# Patient Record
Sex: Male | Born: 1965 | Race: Black or African American | Hispanic: No | Smoking: Never smoker
Health system: Southern US, Community
[De-identification: ages and names within clinical notes are randomized; demographics above are authoritative.]

## PROBLEM LIST (undated history)

## (undated) ENCOUNTER — Emergency Department (HOSPITAL_COMMUNITY): Admission: EM | Payer: Self-pay | Source: Home / Self Care

## (undated) DIAGNOSIS — D763 Other histiocytosis syndromes: Secondary | ICD-10-CM

## (undated) DIAGNOSIS — E8889 Other specified metabolic disorders: Secondary | ICD-10-CM

## (undated) HISTORY — PX: TONSILLECTOMY: SUR1361

---

## 1998-08-03 ENCOUNTER — Encounter: Admission: RE | Admit: 1998-08-03 | Discharge: 1998-08-03 | Payer: Self-pay | Admitting: Family Medicine

## 1999-01-17 ENCOUNTER — Encounter: Admission: RE | Admit: 1999-01-17 | Discharge: 1999-01-17 | Payer: Self-pay | Admitting: Family Medicine

## 1999-09-25 ENCOUNTER — Encounter: Admission: RE | Admit: 1999-09-25 | Discharge: 1999-09-25 | Payer: Self-pay | Admitting: Sports Medicine

## 2000-01-18 ENCOUNTER — Encounter: Admission: RE | Admit: 2000-01-18 | Discharge: 2000-01-18 | Payer: Self-pay | Admitting: Family Medicine

## 2000-02-06 ENCOUNTER — Encounter: Admission: RE | Admit: 2000-02-06 | Discharge: 2000-02-06 | Payer: Self-pay | Admitting: Family Medicine

## 2003-06-10 ENCOUNTER — Encounter: Admission: RE | Admit: 2003-06-10 | Discharge: 2003-06-10 | Payer: Self-pay | Admitting: Family Medicine

## 2003-07-11 ENCOUNTER — Encounter: Admission: RE | Admit: 2003-07-11 | Discharge: 2003-07-11 | Payer: Self-pay | Admitting: Family Medicine

## 2005-05-12 ENCOUNTER — Emergency Department (HOSPITAL_COMMUNITY): Admission: EM | Admit: 2005-05-12 | Discharge: 2005-05-12 | Payer: Self-pay | Admitting: Family Medicine

## 2005-05-20 ENCOUNTER — Ambulatory Visit: Payer: Self-pay | Admitting: Family Medicine

## 2005-06-21 ENCOUNTER — Ambulatory Visit: Payer: Self-pay | Admitting: Family Medicine

## 2005-10-22 ENCOUNTER — Ambulatory Visit: Payer: Self-pay | Admitting: Sports Medicine

## 2006-03-13 ENCOUNTER — Ambulatory Visit: Payer: Self-pay | Admitting: Family Medicine

## 2006-04-15 ENCOUNTER — Ambulatory Visit: Payer: Self-pay | Admitting: *Deleted

## 2006-06-05 DIAGNOSIS — E7800 Pure hypercholesterolemia, unspecified: Secondary | ICD-10-CM

## 2006-06-05 DIAGNOSIS — I10 Essential (primary) hypertension: Secondary | ICD-10-CM

## 2006-06-05 DIAGNOSIS — G43909 Migraine, unspecified, not intractable, without status migrainosus: Secondary | ICD-10-CM | POA: Insufficient documentation

## 2006-06-05 DIAGNOSIS — F329 Major depressive disorder, single episode, unspecified: Secondary | ICD-10-CM

## 2006-07-07 ENCOUNTER — Ambulatory Visit: Payer: Self-pay | Admitting: Family Medicine

## 2006-07-07 DIAGNOSIS — F528 Other sexual dysfunction not due to a substance or known physiological condition: Secondary | ICD-10-CM | POA: Insufficient documentation

## 2006-07-08 LAB — CONVERTED CEMR LAB
BUN: 11 mg/dL (ref 6–23)
CO2: 25 meq/L (ref 19–32)
Calcium: 9 mg/dL (ref 8.4–10.5)
Chloride: 103 meq/L (ref 96–112)
Cholesterol: 201 mg/dL — ABNORMAL HIGH (ref 0–200)
Glucose, Bld: 89 mg/dL (ref 70–99)
Potassium: 4.1 meq/L (ref 3.5–5.3)
Total CHOL/HDL Ratio: 6.1
VLDL: 29 mg/dL (ref 0–40)

## 2006-07-21 ENCOUNTER — Encounter: Payer: Self-pay | Admitting: Family Medicine

## 2006-07-21 DIAGNOSIS — D126 Benign neoplasm of colon, unspecified: Secondary | ICD-10-CM

## 2006-09-18 ENCOUNTER — Telehealth (INDEPENDENT_AMBULATORY_CARE_PROVIDER_SITE_OTHER): Payer: Self-pay | Admitting: *Deleted

## 2006-09-26 ENCOUNTER — Ambulatory Visit: Payer: Self-pay | Admitting: Family Medicine

## 2006-10-08 ENCOUNTER — Ambulatory Visit: Payer: Self-pay | Admitting: Family Medicine

## 2006-10-08 ENCOUNTER — Telehealth: Payer: Self-pay | Admitting: *Deleted

## 2006-10-08 ENCOUNTER — Ambulatory Visit (HOSPITAL_COMMUNITY): Admission: RE | Admit: 2006-10-08 | Discharge: 2006-10-08 | Payer: Self-pay | Admitting: Family Medicine

## 2006-10-09 ENCOUNTER — Inpatient Hospital Stay (HOSPITAL_COMMUNITY): Admission: AD | Admit: 2006-10-09 | Discharge: 2006-10-10 | Payer: Self-pay | Admitting: Family Medicine

## 2006-11-18 ENCOUNTER — Telehealth: Payer: Self-pay | Admitting: Family Medicine

## 2006-12-11 ENCOUNTER — Encounter: Payer: Self-pay | Admitting: Family Medicine

## 2007-01-23 ENCOUNTER — Encounter: Payer: Self-pay | Admitting: Family Medicine

## 2007-02-06 ENCOUNTER — Ambulatory Visit: Payer: Self-pay | Admitting: Family Medicine

## 2007-02-27 ENCOUNTER — Encounter (INDEPENDENT_AMBULATORY_CARE_PROVIDER_SITE_OTHER): Payer: Self-pay | Admitting: *Deleted

## 2008-02-26 ENCOUNTER — Ambulatory Visit: Payer: Self-pay | Admitting: Family Medicine

## 2008-02-29 LAB — CONVERTED CEMR LAB
AST: 28 units/L (ref 0–37)
Albumin: 4.5 g/dL (ref 3.5–5.2)
Alkaline Phosphatase: 47 units/L (ref 39–117)
Calcium: 9.4 mg/dL (ref 8.4–10.5)
Chloride: 101 meq/L (ref 96–112)
Glucose, Bld: 87 mg/dL (ref 70–99)
LDL Cholesterol: 154 mg/dL — ABNORMAL HIGH (ref 0–99)
Potassium: 3.7 meq/L (ref 3.5–5.3)
Sodium: 136 meq/L (ref 135–145)
Total Protein: 8.6 g/dL — ABNORMAL HIGH (ref 6.0–8.3)

## 2008-08-03 ENCOUNTER — Encounter: Payer: Self-pay | Admitting: Family Medicine

## 2008-08-04 DIAGNOSIS — I2510 Atherosclerotic heart disease of native coronary artery without angina pectoris: Secondary | ICD-10-CM | POA: Insufficient documentation

## 2009-03-15 ENCOUNTER — Ambulatory Visit: Payer: Self-pay | Admitting: Family Medicine

## 2009-03-15 DIAGNOSIS — E8881 Metabolic syndrome: Secondary | ICD-10-CM

## 2009-03-15 LAB — CONVERTED CEMR LAB

## 2009-03-16 DIAGNOSIS — R972 Elevated prostate specific antigen [PSA]: Secondary | ICD-10-CM

## 2009-03-16 LAB — CONVERTED CEMR LAB
AST: 18 units/L (ref 0–37)
Albumin: 4.6 g/dL (ref 3.5–5.2)
BUN: 11 mg/dL (ref 6–23)
CO2: 24 meq/L (ref 19–32)
Calcium: 9.5 mg/dL (ref 8.4–10.5)
Chloride: 99 meq/L (ref 96–112)
Cholesterol: 205 mg/dL — ABNORMAL HIGH (ref 0–200)
Glucose, Bld: 80 mg/dL (ref 70–99)
HCT: 43.9 % (ref 39.0–52.0)
HDL: 39 mg/dL — ABNORMAL LOW (ref >39)
Hemoglobin: 15.2 g/dL (ref 13.0–17.0)
PSA: 2.34 ng/mL (ref 0.10–4.00)
Potassium: 4 meq/L (ref 3.5–5.3)
RBC: 4.8 M/uL (ref 4.22–5.81)
Triglycerides: 123 mg/dL (ref <150)

## 2009-03-20 ENCOUNTER — Encounter: Payer: Self-pay | Admitting: Family Medicine

## 2009-03-23 ENCOUNTER — Encounter: Payer: Self-pay | Admitting: Family Medicine

## 2009-03-27 ENCOUNTER — Encounter: Payer: Self-pay | Admitting: Family Medicine

## 2010-04-23 ENCOUNTER — Encounter: Payer: Self-pay | Admitting: Family Medicine

## 2010-05-10 NOTE — Miscellaneous (Signed)
Summary: med refill via fax request  Clinical Lists Changes  Medications: Changed medication from LISINOPRIL-HYDROCHLOROTHIAZIDE 10-12.5 MG  TABS (LISINOPRIL-HYDROCHLOROTHIAZIDE) one by mouth daily to LISINOPRIL-HYDROCHLOROTHIAZIDE 10-12.5 MG  TABS (LISINOPRIL-HYDROCHLOROTHIAZIDE) one by mouth daily - Signed Rx of LISINOPRIL-HYDROCHLOROTHIAZIDE 10-12.5 MG  TABS (LISINOPRIL-HYDROCHLOROTHIAZIDE) one by mouth daily;  #90 x 3;  Signed;  Entered by: Doralee Albino MD;  Authorized by: Doralee Albino MD;  Method used: Electronically to [facility] [address], [place], [place], [place]  [address], Ph: [phone], Fax: [phone]    Prescriptions: LISINOPRIL-HYDROCHLOROTHIAZIDE 10-12.5 MG  TABS (LISINOPRIL-HYDROCHLOROTHIAZIDE) one by mouth daily  #90 x 3   Entered and Authorized by:   Doralee Albino MD   Signed by:   Doralee Albino MD on 04/23/2010   Method used:   Electronically to        [facility] [address] (retail)       [address]       [place], [place]  [address]       Ph: [phone]       Fax: [phone]   RxID:   [id number]

## 2010-05-21 ENCOUNTER — Telehealth: Payer: Self-pay | Admitting: Family Medicine

## 2010-05-21 MED ORDER — TADALAFIL 2.5 MG PO TABS: 2.5000 mg | ORAL_TABLET | Freq: Every day | ORAL | Status: DC

## 2010-05-21 NOTE — Telephone Encounter (Signed)
Called and refilled for daily use.

## 2010-06-07 ENCOUNTER — Telehealth: Payer: Self-pay | Admitting: Family Medicine

## 2010-06-07 NOTE — Telephone Encounter (Signed)
Pt states that he is having a hard time with his insurance company - they are telling him that his doctor has to call them to approve him getting more that 15 pills in a 90 day period of Cialis.   [employer]- [phone]

## 2010-06-15 NOTE — Telephone Encounter (Signed)
Called and discussed options.  No change for now.  He will call back if he wants me to change Rx.

## 2010-08-21 NOTE — Discharge Summary (Signed)
NAMEDANDRAE, KUSTRA                 ACCOUNT NO.:  [id number]   MEDICAL RECORD NO.:  [id number]          PATIENT TYPE:  OBV   LOCATION:  4707                         FACILITY:  [facility]   PHYSICIAN:  Leighton Roach McDiarmid, M.D.DATE OF BIRTH:  01/12/1966   DATE OF ADMISSION:  10/08/2006  DATE OF DISCHARGE:  10/10/2006                               DISCHARGE SUMMARY   PRIMARY CARE PHYSICIAN:  Dr. Leveda Anna at the [facility]  Center.   DISCHARGE DIAGNOSES:  1. Coronary artery disease.  2. Hypertension.   DISCHARGE MEDICATIONS:  1. Hydrochlorothiazide 25 mg daily.  2. Aspirin 325 mg daily.  3. Simvastatin 20 mg in the evening.   CONSULTATIONS:  Cardiology, Dr. Allyson Sabal of [facility] and  Vascular.   PROCEDURES:  A heart catheterization was done on October 09, 2006 by Dr.  Allyson Sabal, as well as an EKG on the morning of July 3, which showed T wave  inversion in leads V3 through V6.   LABORATORY DATA:  Labs on discharge were the following:  A sodium was  137, potassium 3.1, chloride 100, bicarb 30, glucose 96, BUN 7,  creatinine 0.91, calcium 9.0.  WBCs 11.9, hemoglobin 15.1, hematocrit  44.9, and a platelet count of 289.   HOSPITAL COURSE:  Mr. Strohm is a 45 year old African American male,  nondiabetic, smoker, with a history of hypertension.  He was admitted  from the family practice center for new onset atypical chest pain  believed to be unstable angina.  The pain was substernal and came on at  rest.  It lasted several minutes, and went away on its own.  It occurred  once the day before admission, and awoke him from sleep the night before  admission.  An ECG done in the office showed inverted T waves in leads  V4 through V6.  He was admitted to a telemetry floor, started on  aspirin, Protonix 40 mg daily, simvastatin 20 mg daily, as well as  nitroglycerin 0.4 mg sublingually q.5 minutes p.r.n.  Cardiac enzymes  were drawn and were negative x3.  An LDL drawn at a previous  family  medicine center visit were found to be 139.  An ECG done on July 3  showed inverted T waves in V3 through V6 with the V3 a new development.  The patient also had 2 episodes of chest pain, lasting approximately 15-  20 seconds while in the hospital.  Due to new ECG changes and the T wave  change in lead V3, as well as continued chest pain, cardiology was  consulted to further evaluate and treat.  The patient was seen by Dr.  Allyson Sabal of [facility] and Vascular on October 09, 2006, and the  decision was made to do a heart catheterization.  He was found to have  noncritical coronary artery disease, and Dr. Allyson Sabal recommended risk  factor modification, including diet, exercise, and smoking cessation, as  well as the daily aspirin and statin.   On October 10, 2006, the patient was discharged home following  administration of 40 mEq of K-Dur p.o. x1 to  correct for the low  potassium drawn that morning.  He was sent home with instructions to  follow up with Dr. Allyson Sabal at [facility] and Vascular.  He was  told though that Dr. Hazle Coca office would be contacting him to setup the  appointment, and this should be in approximately 1-2 weeks.  He was also  instructed to follow up with Dr. Leveda Anna, his primary care physician at  the [facility].  The number for the center was  given, as the office was closed, so the appointment could not be made  for him.  A prescription for simvastatin was also given to him, as he  was not currently on it before he came into the hospital.   DISCHARGE INSTRUCTIONS:  He was sent home with the above instructions to  follow up with Dr. Leveda Anna and Dr. Allyson Sabal.  He was also advised to wash  his right groin cath site with soap and water, and to call Dr. Hazle Coca  office if there was any bleeding, swelling or drainage of the site.  He  was also advised to increase his activity slowly, and was told no  lifting more than 50 pounds for 2 days, and  no driving for 2 days.  He  was instructed to follow a low-sodium, heart healthy diet.  Followup  appointments as stated above.  He is to see Dr. Allyson Sabal at his office  within the next 1-2 weeks.  Dr. Hazle Coca office will be contacting him to  setup this appointment.  He is also to follow up with Dr. Leveda Anna in the  [facility] in 1-2 weeks.  He needs to call to  make this appointment himself.  The patient was discharged home in  stable medical condition.   Issues to Follow up on:  1. Check Potassium  2. Check lipids  3. Blood Pressure  4. Smoking Cessation  5. Attempts at lifestyle modification      Ardeen Garland, MD  Electronically Signed      Leighton Roach McDiarmid, M.D.  Electronically Signed    LM/MEDQ  D:  10/10/2006  T:  10/10/2006  Job:  [id number]

## 2010-08-21 NOTE — Cardiovascular Report (Signed)
NAMEMIKLOS, Marco Ballard NO.:  [id number]   MEDICAL RECORD NO.:  [id number]          PATIENT TYPE:  OBV   LOCATION:  4707                         FACILITY:  [facility]   PHYSICIAN:  Nanetta Batty, M.D.   DATE OF BIRTH:  1965-06-19   DATE OF PROCEDURE:  DATE OF DISCHARGE:                            CARDIAC CATHETERIZATION   Marco Ballard is a 45 year old, separated, African-American male who works  at __________  with positive risk factors and recent symptoms compatible  with unstable angina.  He has been admitted with chest pain.  He ruled  out.  He had anterolateral T-wave inversion treated with aspirin,  Plavix, and Lovenox.  He presents now for diagnostic coronary  arteriography to define his anatomy and rule out an ischemic etiology.   DESCRIPTION OF PROCEDURE:  The patient was brought to the second floor  [facility] cardiac catheterization lab in the postabsorptive state.  He  was premedicated with p.o. Valium.  His right groin was prepped and  shaved in the usual sterile fashion.  One percent Xylocaine was used for  local anesthesia.  A 6-French sheath was inserted into the right femoral  artery using standard Seldinger technique.  The 6-French right and left  Judkins diagnostic catheters along with 6-French pigtail catheter were  used for selective coronary angiography, left ventriculography,  respectively.  Visipaque dye was used for the entirety of the case.  Retrograde aortic, ventricular and pullback pressures were recorded.   HEMODYNAMICS:  Aortic systolic pressure 123, diastolic pressure 77.   Ventricle systolic pressure 122, end-diastolic pressure 9.   SELECTIVE CORONARY ANGIOGRAPHY:  1. Left main normal.  2. LAD; the LAD had a 40-50% stenosis in the midportion just before      __________  diagonal branch.  3. Circumflex; nondominant and free of significant disease.  4. Ramus branch; moderate in size and free of significant disease.  5. Right coronary  artery; dominant with 50% stenosis at the crux.   Left ventriculography; RAO left ventriculogram performed using 25 mL of  Visipaque dye at 12 mL per second.  Overall LVEF was estimated at  greater than 60% without focal wall motion abnormalities.   IMPRESSION:  Marco Ballard has noncritical CAD with normal LV function.  I  believe his chest pain is noncardiac, most likely related to reflux  and/or stress.  Sheath was removed, and pressure was applied to the  groin to achieve  hemostasis.  The patient left the lab in stable condition.  He will be  discharged home in the morning and will see him back in the office in 1  to 2 weeks for follow-up.  Chronic risk factor modification including  statin therapy, aspirin and antihypertensive medications will be  recommended.      Nanetta Batty, M.D.  Electronically Signed     JB/MEDQ  D:  10/09/2006  T:  10/10/2006  Job:  [id number]   cc:   [facility]  [facility]  Chrissie Noa A. Leveda Anna, M.D.

## 2011-01-22 LAB — CBC
HCT: 42.9
HCT: 46.2
MCHC: 33.6
MCV: 90
Platelets: 235
RBC: 4.77
RBC: 5.09
RDW: 13.1
WBC: 11.4 — ABNORMAL HIGH
WBC: 11.4 — ABNORMAL HIGH

## 2011-01-22 LAB — BASIC METABOLIC PANEL
BUN: 10
BUN: 11
CO2: 27
CO2: 29
CO2: 30
Calcium: 9
Chloride: 100
Chloride: 101
Creatinine, Ser: 0.91
Creatinine, Ser: 0.95
Glucose, Bld: 96
Potassium: 3.5

## 2011-01-22 LAB — CARDIAC PANEL(CRET KIN+CKTOT+MB+TROPI)
Relative Index: 0.7
Total CK: 498 — ABNORMAL HIGH
Troponin I: 0.01
Troponin I: 0.02
Troponin I: 0.02

## 2011-01-22 LAB — D-DIMER, QUANTITATIVE: D-Dimer, Quant: <0.22

## 2011-07-02 ENCOUNTER — Other Ambulatory Visit: Payer: Self-pay | Admitting: Family Medicine

## 2011-08-22 ENCOUNTER — Other Ambulatory Visit: Payer: Self-pay | Admitting: Family Medicine

## 2012-03-25 ENCOUNTER — Ambulatory Visit (INDEPENDENT_AMBULATORY_CARE_PROVIDER_SITE_OTHER): Payer: Managed Care, Other (non HMO) | Admitting: Family Medicine

## 2012-03-25 ENCOUNTER — Encounter: Payer: Self-pay | Admitting: Family Medicine

## 2012-03-25 VITALS — BP 138/90 | HR 81 | Temp 99.0°F | Ht 69.0 in | Wt 265.2 lb

## 2012-03-25 DIAGNOSIS — E7800 Pure hypercholesterolemia, unspecified: Secondary | ICD-10-CM

## 2012-03-25 DIAGNOSIS — I10 Essential (primary) hypertension: Secondary | ICD-10-CM

## 2012-03-25 DIAGNOSIS — I2510 Atherosclerotic heart disease of native coronary artery without angina pectoris: Secondary | ICD-10-CM

## 2012-03-25 DIAGNOSIS — Z0000 Encounter for general adult medical examination without abnormal findings: Secondary | ICD-10-CM

## 2012-03-25 DIAGNOSIS — E8881 Metabolic syndrome: Secondary | ICD-10-CM

## 2012-03-25 DIAGNOSIS — D126 Benign neoplasm of colon, unspecified: Secondary | ICD-10-CM

## 2012-03-25 NOTE — Patient Instructions (Addendum)
Please call Dr Haywood Pao office.  My records indicate 2008, which means you would be due.  Call me and let me know when last colonoscopy.   I am doing a lot of blood work.  I will call with results.  I will likely need to start you on a cholesterol medicine again, but I will wait for the blood results. Please sign up for my chart.  That way you can see your results on the computer. Good luck with the weight loss plan.  It is important for your high blood pressure and to avoid diabetes.

## 2012-03-26 ENCOUNTER — Encounter: Payer: Self-pay | Admitting: Family Medicine

## 2012-03-26 DIAGNOSIS — Z0000 Encounter for general adult medical examination without abnormal findings: Secondary | ICD-10-CM | POA: Insufficient documentation

## 2012-03-26 LAB — COMPLETE METABOLIC PANEL WITH GFR
ALT: 18 U/L (ref 0–53)
Albumin: 4 g/dL (ref 3.5–5.2)
CO2: 27 mEq/L (ref 19–32)
Calcium: 9 mg/dL (ref 8.4–10.5)
Chloride: 103 mEq/L (ref 96–112)
Creat: 0.84 mg/dL (ref 0.50–1.35)
GFR, Est African American: >89 mL/min

## 2012-03-26 LAB — LIPID PANEL
Cholesterol: 222 mg/dL — ABNORMAL HIGH (ref 0–200)
LDL Cholesterol: 150 mg/dL — ABNORMAL HIGH (ref 0–99)
Triglycerides: 181 mg/dL — ABNORMAL HIGH (ref <150)

## 2012-03-26 MED ORDER — ATORVASTATIN CALCIUM 40 MG PO TABS: 40.0000 mg | ORAL_TABLET | Freq: Every day | ORAL | Status: AC

## 2012-03-26 NOTE — Assessment & Plan Note (Signed)
Asymptomatic, but am concerned with no statin, increased wt and borderline BP control  Close FU

## 2012-03-26 NOTE — Assessment & Plan Note (Signed)
Borderline control, monitor for now,  Recheck three months.  Consider add BB if elevated.

## 2012-03-26 NOTE — Assessment & Plan Note (Signed)
Obesity, hypertension, HgbA1C = 6.1 makes clear metabolic syndrome.  Motivated for wt loss.

## 2012-03-26 NOTE — Assessment & Plan Note (Signed)
Desperately needs improved diet, exercise and wt loss.  He seems motivated.

## 2012-03-26 NOTE — Progress Notes (Signed)
  Subjective:    Patient ID: Marco Ballard, male    DOB: 11/22/1965, 46 y.o.   MRN: [id number]  HPI  Annual physical.  No complaints.  Recognizes that his weight is up and has lofty goals for weight loss over the next year.   HBP: Gets it checked at work and is borderline - sometimes running high.  Taking meds as prescribed. Known CAD: On ASA.  No chest pain High cholesterol.  Has somehow dropped his statin prescription.  Goal LDL < 100 due to CAD Emotional: fine with divorce.  No biologic children.  Does have one stepson with whom he has a good relationship.  Unfortunately, his mother has developed dementia and he is her major caregiver.  He is in a stable, monogamous relationship.      Review of Systems No CP, SOB, syncope, wt loss or bleeding.     Objective:   Physical ExamHEENT WNL Neck no nodes or thyromegally Lungs clear Cardiac RRR without m or gallop Abd benign.        Assessment & Plan:

## 2012-03-26 NOTE — Assessment & Plan Note (Addendum)
LDL=150 and goal <100 with CAD.  Start atorvastatin.

## 2012-03-26 NOTE — Assessment & Plan Note (Signed)
Check when last colonoscopy.

## 2013-02-11 ENCOUNTER — Other Ambulatory Visit: Payer: Self-pay

## 2014-04-09 ENCOUNTER — Encounter (HOSPITAL_COMMUNITY): Payer: Self-pay | Admitting: Nurse Practitioner

## 2014-04-09 ENCOUNTER — Emergency Department (HOSPITAL_COMMUNITY): Payer: Managed Care, Other (non HMO)

## 2014-04-09 ENCOUNTER — Emergency Department (HOSPITAL_COMMUNITY)
Admission: EM | Admit: 2014-04-09 | Discharge: 2014-04-10 | Disposition: A | Discharge status: Other Acute Inpatient Hospital | Payer: Managed Care, Other (non HMO) | Attending: Emergency Medicine | Admitting: Emergency Medicine

## 2014-04-09 DIAGNOSIS — Z79899 Other long term (current) drug therapy: Secondary | ICD-10-CM | POA: Insufficient documentation

## 2014-04-09 DIAGNOSIS — M545 Low back pain, unspecified: Secondary | ICD-10-CM

## 2014-04-09 DIAGNOSIS — Z880 Allergy status to penicillin: Secondary | ICD-10-CM | POA: Insufficient documentation

## 2014-04-09 DIAGNOSIS — Z862 Personal history of diseases of the blood and blood-forming organs and certain disorders involving the immune mechanism: Secondary | ICD-10-CM | POA: Insufficient documentation

## 2014-04-09 DIAGNOSIS — Z7982 Long term (current) use of aspirin: Secondary | ICD-10-CM | POA: Insufficient documentation

## 2014-04-09 DIAGNOSIS — R52 Pain, unspecified: Secondary | ICD-10-CM

## 2014-04-09 DIAGNOSIS — R079 Chest pain, unspecified: Secondary | ICD-10-CM | POA: Insufficient documentation

## 2014-04-09 HISTORY — DX: Other specified metabolic disorders: E88.89

## 2014-04-09 HISTORY — DX: Other histiocytosis syndromes: D76.3

## 2014-04-09 LAB — CBC WITH DIFFERENTIAL/PLATELET
Basophils Absolute: 0 10*3/uL (ref 0.0–0.1)
Basophils Relative: 0 % (ref 0–1)
Eosinophils Absolute: 0 10*3/uL (ref 0.0–0.7)
Eosinophils Relative: 0 % (ref 0–5)
HEMATOCRIT: 43.1 % (ref 39.0–52.0)
HEMOGLOBIN: 14.5 g/dL (ref 13.0–17.0)
LYMPHS ABS: 2 10*3/uL (ref 0.7–4.0)
Lymphocytes Relative: 13 % (ref 12–46)
MCH: 29.5 pg (ref 26.0–34.0)
MCHC: 33.6 g/dL (ref 30.0–36.0)
MCV: 87.6 fL (ref 78.0–100.0)
MONO ABS: 0.9 10*3/uL (ref 0.1–1.0)
MONOS PCT: 6 % (ref 3–12)
NEUTROS ABS: 12.6 10*3/uL — AB (ref 1.7–7.7)
NEUTROS PCT: 81 % — AB (ref 43–77)
Platelets: 301 10*3/uL (ref 150–400)
RBC: 4.92 MIL/uL (ref 4.22–5.81)
RDW: 13.9 % (ref 11.5–15.5)
WBC: 15.5 10*3/uL — ABNORMAL HIGH (ref 4.0–10.5)

## 2014-04-09 LAB — COMPREHENSIVE METABOLIC PANEL
ALK PHOS: 63 U/L (ref 39–117)
ALT: 51 U/L (ref 0–53)
AST: 44 U/L — ABNORMAL HIGH (ref 0–37)
Albumin: 3.7 g/dL (ref 3.5–5.2)
Anion gap: 11 (ref 5–15)
BUN: 9 mg/dL (ref 6–23)
CALCIUM: 9.1 mg/dL (ref 8.4–10.5)
CO2: 23 mmol/L (ref 19–32)
Chloride: 105 mEq/L (ref 96–112)
Creatinine, Ser: 0.96 mg/dL (ref 0.50–1.35)
Glucose, Bld: 139 mg/dL — ABNORMAL HIGH (ref 70–99)
Potassium: 3.7 mmol/L (ref 3.5–5.1)
Sodium: 139 mmol/L (ref 135–145)
Total Bilirubin: 0.5 mg/dL (ref 0.3–1.2)
Total Protein: 9.3 g/dL — ABNORMAL HIGH (ref 6.0–8.3)

## 2014-04-09 LAB — TROPONIN I: Troponin I: 0.03 ng/mL (ref <0.031)

## 2014-04-09 MED ORDER — ASPIRIN 81 MG PO CHEW
324.0000 mg | CHEWABLE_TABLET | Freq: Once | ORAL | Status: AC
  Administered 2014-04-09: 324 mg via ORAL
  Filled 2014-04-09: qty 4

## 2014-04-09 MED ORDER — HYDROMORPHONE HCL 1 MG/ML IJ SOLN
1.0000 mg | Freq: Once | INTRAMUSCULAR | Status: AC
  Administered 2014-04-09: 1 mg via INTRAVENOUS
  Filled 2014-04-09: qty 1

## 2014-04-09 MED ORDER — ONDANSETRON HCL 4 MG/2ML IJ SOLN
4.0000 mg | Freq: Once | INTRAMUSCULAR | Status: AC
  Administered 2014-04-09: 4 mg via INTRAMUSCULAR
  Filled 2014-04-09: qty 2

## 2014-04-09 NOTE — ED Notes (Signed)
Per EMS pt from home has a  Bone disorder called erdhirem chester- pt took nap and woke up with severe back pain- pt unable to get off ground. Pt sts pain radiates from lower back up and around to chest. Upon ems arrival arms and chest were cold and diaphoretic and head and legs were warm and dry. Pt in NAD  146mcg of fentanyl took pain from 10/10 to 7/10 at present time.

## 2014-04-09 NOTE — ED Notes (Signed)
Sophia PA at bedside.  

## 2014-04-09 NOTE — ED Notes (Signed)
Pt unable to tolerate xray due to pain. PT sts pain is better at present time- RN made Provider aware and will redose with pain meds and send to [facility]

## 2014-04-09 NOTE — ED Provider Notes (Signed)
CSN: [id number]     Arrival date & time 04/09/14  1843 History   First MD Initiated Contact with Patient 04/09/14 1857     Chief Complaint  Patient presents with  . Back Pain     (Consider location/radiation/quality/duration/timing/severity/associated sxs/prior Treatment) Patient is a 49 y.o. male presenting with chest pain. The history is provided by the patient. No language interpreter was used.  Chest Pain Pain location:  L chest and R chest Pain quality: aching   Pain radiates to:  Does not radiate Pain radiates to the back: yes   Pain severity:  Severe Onset quality:  Sudden Duration:  4 hours Timing:  Constant Progression:  Worsening Chronicity:  New Relieved by:  Nothing Worsened by:  Nothing tried Ineffective treatments:  None tried Associated symptoms: back pain   Associated symptoms: no abdominal pain     Past Medical History  Diagnosis Date  . ECD (Erdheim-Chester disease)    Past Surgical History  Procedure Laterality Date  . Tonsillectomy     No family history on file. History  Substance Use Topics  . Smoking status: Never Smoker   . Smokeless tobacco: Never Used  . Alcohol Use: 1.2 oz/week    0.5 Not specified, 2 Cans of beer per week    Review of Systems  Cardiovascular: Positive for chest pain.  Gastrointestinal: Negative for abdominal pain.  Musculoskeletal: Positive for back pain.  All other systems reviewed and are negative.     Allergies  Amoxicillin  Home Medications   Prior to Admission medications   Medication Sig Start Date End Date Taking? Authorizing Provider  aspirin 325 MG tablet Take 325 mg by mouth daily.      Historical Provider, MD  atorvastatin (LIPITOR) 40 MG tablet Take 1 tablet (40 mg total) by mouth daily. 03/26/12   Zigmund Gottron, MD  CIALIS 2.5 MG TABS TAKE ONE TABLET BY MOUTH EVERY DAY 07/02/11   Lind Covert, MD  lisinopril-hydrochlorothiazide (PRINZIDE,ZESTORETIC) 10-12.5 MG per tablet TAKE ONE  TABLET BY MOUTH EVERY DAY 08/22/11   Zigmund Gottron, MD   BP 159/102 mmHg  Pulse 103  Temp(Src) 98 F (36.7 C) (Oral)  Resp 24  Ht 5\' 10"  (1.778 m)  Wt 265 lb (120.203 kg)  BMI 38.02 kg/m2  SpO2 92% Physical Exam  Constitutional: He is oriented to person, place, and time. He appears well-developed and well-nourished.  HENT:  Head: Normocephalic and atraumatic.  Right Ear: External ear normal.  Left Ear: External ear normal.  Eyes: Conjunctivae and EOM are normal. Pupils are equal, round, and reactive to light.  Neck: Normal range of motion.  Cardiovascular: Normal rate and normal heart sounds.   Pulmonary/Chest: Effort normal and breath sounds normal.  Abdominal: Soft. He exhibits no distension.  Musculoskeletal: Normal range of motion.  Neurological: He is alert and oriented to person, place, and time.  Skin: Skin is warm.  Psychiatric: He has a normal mood and affect.  Nursing note and vitals reviewed.   ED Course  Procedures (including critical care time) Labs Review Labs Reviewed  CBC WITH DIFFERENTIAL - Abnormal; Notable for the following:    WBC 15.5 (*)    Neutrophils Relative % 81 (*)    Neutro Abs 12.6 (*)    All other components within normal limits  COMPREHENSIVE METABOLIC PANEL - Abnormal; Notable for the following:    Glucose, Bld 139 (*)    Total Protein 9.3 (*)    AST 44 (*)  All other components within normal limits  TROPONIN I    Imaging Review Dg Chest 2 View  04/09/2014   CLINICAL DATA:  Initial evaluation for acute back pain.  EXAM: CHEST  2 VIEW  COMPARISON:  Prior radiograph from 10/08/2006.  FINDINGS: Accentuation of the cardiac silhouette related to shallow lung inflation present. Mediastinal silhouette within normal limits.  Lungs are hypoinflated with secondary bronchovascular crowding. No definite focal infiltrates. No pulmonary edema or pleural effusion. No pneumothorax.  No acute osseus abnormality. Scattered multilevel degenerative  changes present within the visualized spine.  IMPRESSION: Shallow lung inflation with secondary bronchovascular crowding. No other definite active cardiopulmonary disease identified.   Electronically Signed   By: Jeannine Boga M.D.   On: 04/09/2014 21:17     EKG Interpretation None    EKG Tachycardia  atrail enlargement,  Nonspecific st changes  MDM   Final diagnoses:  Pain  Chest pain, unspecified chest pain type  Bilateral low back pain without sciatica   I spoke with Dr. Shary Decamp at [facility] who advised transfer to ED.  I spoke to Dr. Susanne Borders in ED to make aware     Fransico Meadow, PA-C 04/10/14 0001  Dot Lanes, MD 04/10/14 [phone]

## 2014-04-10 MED ORDER — HYDROMORPHONE HCL 1 MG/ML IJ SOLN
1.0000 mg | Freq: Once | INTRAMUSCULAR | Status: AC
  Administered 2014-04-10: 1 mg via INTRAVENOUS
  Filled 2014-04-10: qty 1

## 2014-04-10 NOTE — ED Notes (Signed)
[facility] at bedside.  Patient transferred to [facility] ED.

## 2014-10-15 ENCOUNTER — Emergency Department (INDEPENDENT_AMBULATORY_CARE_PROVIDER_SITE_OTHER)
Admission: EM | Admit: 2014-10-15 | Discharge: 2014-10-15 | Disposition: A | Discharge status: Home / Self Care | Payer: Self-pay | Source: Home / Self Care | Attending: Family Medicine | Admitting: Family Medicine

## 2014-10-15 ENCOUNTER — Encounter (HOSPITAL_COMMUNITY): Payer: Self-pay | Admitting: *Deleted

## 2014-10-15 DIAGNOSIS — T402X5A Adverse effect of other opioids, initial encounter: Secondary | ICD-10-CM

## 2014-10-15 DIAGNOSIS — K5909 Other constipation: Secondary | ICD-10-CM

## 2014-10-15 DIAGNOSIS — K5903 Drug induced constipation: Secondary | ICD-10-CM

## 2014-10-15 MED ORDER — PEG 3350-KCL-NA BICARB-NACL 420 G PO SOLR: 4000.0000 mL | Freq: Once | ORAL | Status: AC

## 2014-10-15 NOTE — ED Notes (Signed)
Pt  Has  questians  About  The plan of  Care      And  Dr  Rosalie Doctor  Again  With  Pt     And  Advised    Him     On the  Plan of  care

## 2014-10-15 NOTE — ED Provider Notes (Signed)
CSN: [id number]     Arrival date & time 10/15/14  1814 History   First MD Initiated Contact with Patient 10/15/14 1819     Chief Complaint  Patient presents with  . Constipation   (Consider location/radiation/quality/duration/timing/severity/associated sxs/prior Treatment) Patient is a 49 y.o. male presenting with constipation. The history is provided by the patient.  Constipation Severity:  Mild Timing:  Sporadic Chronicity:  Chronic Context: narcotics   Context comment:  On narcotics from [place] and having issues with constipation, seen yest. Stool description:  Large and hard Relieved by:  None tried Worsened by:  Nothing tried Associated symptoms: no abdominal pain, no diarrhea, no nausea and no vomiting     Past Medical History  Diagnosis Date  . ECD (Erdheim-Chester disease)    Past Surgical History  Procedure Laterality Date  . Tonsillectomy     History reviewed. No pertinent family history. History  Substance Use Topics  . Smoking status: Never Smoker   . Smokeless tobacco: Never Used  . Alcohol Use: 1.2 oz/week    0.5 Standard drinks or equivalent, 2 Cans of beer per week    Review of Systems  Gastrointestinal: Positive for constipation. Negative for nausea, vomiting, abdominal pain, diarrhea and blood in stool.    Allergies  Amoxicillin  Home Medications   Prior to Admission medications   Medication Sig Start Date End Date Taking? Authorizing Provider  GABAPENTIN PO Take by mouth.   Yes Historical Provider, MD  MORPHINE SULFATE PO Take by mouth.   Yes Historical Provider, MD  OXYCODONE PO Take by mouth.   Yes Historical Provider, MD  aspirin 325 MG tablet Take 325 mg by mouth daily.      Historical Provider, MD  atorvastatin (LIPITOR) 40 MG tablet Take 1 tablet (40 mg total) by mouth daily. 03/26/12   Zenia Resides, MD  CIALIS 2.5 MG TABS TAKE ONE TABLET BY MOUTH EVERY DAY 07/02/11   Lind Covert, MD  lisinopril-hydrochlorothiazide  (PRINZIDE,ZESTORETIC) 10-12.5 MG per tablet TAKE ONE TABLET BY MOUTH EVERY DAY 08/22/11   Zenia Resides, MD  polyethylene glycol-electrolytes (NULYTELY/GOLYTELY) 420 G solution Take 4,000 mLs by mouth once. 10/15/14   Billy Fischer, MD   BP 184/118 mmHg  Pulse 108  Temp(Src) 99.8 F (37.7 C) (Oral)  Resp 18  SpO2 100% Physical Exam  Constitutional: He is oriented to person, place, and time. He appears well-developed and well-nourished. No distress.  Neck: Normal range of motion. Neck supple.  Cardiovascular: Normal heart sounds.   Pulmonary/Chest: Effort normal and breath sounds normal. No respiratory distress. He has no wheezes.  Abdominal: Soft. Bowel sounds are normal. He exhibits no distension and no mass. There is no tenderness. There is no rebound and no guarding.  Lymphadenopathy:    He has no cervical adenopathy.  Neurological: He is alert and oriented to person, place, and time.  Skin: Skin is warm and dry.  Nursing note and vitals reviewed.   ED Course  Procedures (including critical care time) Labs Review Labs Reviewed - No data to display  Imaging Review No results found.   MDM   1. Constipation due to opioid therapy        Billy Fischer, MD 10/17/14 1302

## 2014-10-15 NOTE — Discharge Instructions (Signed)
Use medicine as prescribed and return to [place] if further problems

## 2014-10-15 NOTE — ED Notes (Addendum)
Pt      Reports    Rectal pain   And   Constipation     Pt  Taking   Pain  meds       [facility]        And  Reports  Has  Not had a  Good  bm  Lately        He  Reports  A  Sensation  Of  Shortness  Of  Breath         He  States       He  Is  Not  On  Any    Blood  Pressure  meds

## 2015-02-16 ENCOUNTER — Other Ambulatory Visit: Payer: Self-pay | Admitting: *Deleted

## 2015-02-17 MED ORDER — TADALAFIL 2.5 MG PO TABS: 1.0000 | ORAL_TABLET | Freq: Every day | ORAL | Status: AC

## 2018-01-01 HISTORY — PX: OTHER SURGICAL HISTORY: SHX169

## 2018-01-08 HISTORY — PX: OTHER SURGICAL HISTORY: SHX169

## 2018-04-14 ENCOUNTER — Encounter: Payer: Self-pay | Admitting: Physical Therapy

## 2018-04-14 ENCOUNTER — Ambulatory Visit: Payer: No Typology Code available for payment source | Attending: Orthopedic Surgery | Admitting: Physical Therapy

## 2018-04-14 ENCOUNTER — Other Ambulatory Visit: Payer: Self-pay

## 2018-04-14 DIAGNOSIS — M25561 Pain in right knee: Secondary | ICD-10-CM | POA: Insufficient documentation

## 2018-04-14 DIAGNOSIS — R262 Difficulty in walking, not elsewhere classified: Secondary | ICD-10-CM | POA: Insufficient documentation

## 2018-04-14 DIAGNOSIS — M6281 Muscle weakness (generalized): Secondary | ICD-10-CM | POA: Insufficient documentation

## 2018-04-14 NOTE — Therapy (Signed)
[place] [address] [place], [place], [address] Phone: [phone]   Fax:  [phone]  Physical Therapy Evaluation  Patient Details  Name: Marco Ballard MRN: [id number] Date of Birth: [birth year] Referring Provider (PT): Gara Kroner [facility]   Encounter Date: 04/14/2018  PT End of Session - 04/14/18 1451    Visit Number  1    Date for PT Re-Evaluation  06/13/18    PT Start Time  1109    PT Stop Time  1200    PT Time Calculation (min)  51 min    Activity Tolerance  Patient tolerated treatment well    Behavior During Therapy  [facility] for tasks assessed/performed       Past Medical History:  Diagnosis Date  . ECD (Erdheim-Chester disease) [facility])     Past Surgical History:  Procedure Laterality Date  . distal femur replacement Right 01/08/2018  . IMN left femur Left 01/01/2018  . TONSILLECTOMY      There were no vitals filed for this visit.   Subjective Assessment - 04/14/18 1124    Subjective  Patient reports that about 2 years ago.  HE has a rare blood cancer.  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  He had an IM nail on the left 01/01/18, then had a right distal femur replacement 01/08/18.  He had HHPT until about 2 weeks ago.  He reports that he was doing well until yesterday and report sthat he started having increased pain and more diffiuclty walking, he is unsure of why.  He reports that he was using a cane some, now is using a FWW    Limitations  Lifting;Walking;Standing;House hold activities    Patient Stated Goals  walk without cane, have no pain, possibly play golf    Currently in Pain?  Yes    Pain Score  1     Pain Location  Knee    Pain Orientation  Right    Pain Descriptors / Indicators  Aching;Dull    Pain Type  Acute pain    Pain Onset  More than a month ago    Pain Frequency  Intermittent    Aggravating Factors   walking, standing10/10 in the right knee    Pain Relieving  Factors  sit and rest pain will go away 0/10    Effect of Pain on Daily Activities  limits all activities         [facility] PT Assessment - 04/14/18 0001      Assessment   Medical Diagnosis  right knee pain, difficulty walking, weakness    Referring Provider (PT)  Gara Kroner [facility]    Onset Date/Surgical Date  01/08/18    Prior Therapy  at home      Precautions   Precautions  None      Restrictions   Other Position/Activity Restrictions  WBAT      Balance Screen   Has the patient fallen in the past 6 months  Yes    How many times?  7    Has the patient had a decrease in activity level because of a fear of falling?   Yes    Is the patient reluctant to leave their home because of a fear of falling?   Yes      Home Environment   Additional Comments  2 steps into the home, lives alone, does some housework      Prior Function  Level of Independence  Independent with household mobility with device    Vocation  On disability    Leisure  prior to 3 years ago was playing golf 2-3x/week      Observation/Other Assessments-Edema    Edema  Circumferential      Circumferential Edema   Circumferential - Right  47 cm mid patella`    Circumferential - Left   44cm      ROM / Strength   AROM / PROM / Strength  AROM;Strength      AROM   AROM Assessment Site  Hip;Knee    Right/Left Hip  Right;Left    Right Hip Flexion  50   standing   Right/Left Knee  Right;Left    Right Knee Extension  20    Right Knee Flexion  103    Left Knee Extension  8    Left Knee Flexion  110      Strength   Strength Assessment Site  Hip;Knee    Right/Left Hip  Right;Left    Right Hip Flexion  3-/5    Right Hip ABduction  3+/5    Right Hip ADduction  3+/5    Left Hip Flexion  4/5    Left Hip ABduction  4/5    Left Hip ADduction  4/5    Right/Left Knee  Right;Left    Right Knee Flexion  3/5    Right Knee Extension  3+/5    Left Knee Flexion  4-/5    Left Knee Extension  4-/5      Palpation    Palpation comment  has swelling in the right knee, mild warmth      Ambulation/Gait   Gait Comments  uses a FWW, slow, very antalgic on the right, step to pattern      Standardized Balance Assessment   Standardized Balance Assessment  Timed Up and Go Test      Timed Up and Go Test   Normal TUG (seconds)  50                Objective measurements completed on examination: See above findings.      [facility] Adult PT Treatment/Exercise - 04/14/18 0001      Exercises   Exercises  Knee/Hip      Knee/Hip Exercises: Aerobic   Nustep  level 3 x 6 minutes               PT Short Term Goals - 04/14/18 1513      PT SHORT TERM GOAL #1   Title  independent with initial HEP    Time  2    Period  Weeks    Status  New        PT Long Term Goals - 04/14/18 1514      PT LONG TERM GOAL #1   Title  decrease pain 25%    Time  8    Period  Weeks    Status  New      PT LONG TERM GOAL #2   Title  walk with SPC x 300 feet    Time  8    Period  Weeks    Status  New      PT LONG TERM GOAL #3   Title  incresae right hip flexion in standing to 90 degrees    Time  8    Period  Weeks    Status  New      PT LONG TERM GOAL #4  Title  increase right hip strength to 4-/5    Time  8    Period  Weeks    Status  New      PT LONG TERM GOAL #5   Title  increase right knee strength to 4/5    Time  8    Period  Weeks    Status  New             Plan - 04/14/18 1500    Clinical Impression Statement  Patient with right distal femur replacement on 01/08/18, he also had a left IMnailing 01/01/18.  He was having home PT and reports that he was doing well, walking with a SPC at times, reports over the past few days he has had increased right knee and hip pain, he is using a FWW today, slow step to gait very antalgic on the right, reports it feels like the leg is going to give out.  He is very weak in the right hip and the right knee    Clinical Presentation  Evolving     Clinical Decision Making  Moderate    Rehab Potential  Good    PT Frequency  2x / week    PT Duration  8 weeks    PT Treatment/Interventions  ADLs/Self Care Home Management;Cryotherapy;Electrical Stimulation;Moist Heat;Therapeutic exercise;Therapeutic activities;Functional mobility training;Stair training;Gait training;Balance training;Neuromuscular re-education;Patient/family education;Manual techniques    PT Next Visit Plan  start exercises to help with ROM and strength and help with function    Consulted and Agree with Plan of Care  Patient       Patient will benefit from skilled therapeutic intervention in order to improve the following deficits and impairments:  Pain, Abnormal gait, Decreased mobility, Postural dysfunction, Decreased strength, Decreased range of motion, Decreased activity tolerance, Decreased balance, Difficulty walking, Impaired flexibility  Visit Diagnosis: Acute pain of right knee - Plan: PT plan of care cert/re-cert  Difficulty in walking, not elsewhere classified - Plan: PT plan of care cert/re-cert  Muscle weakness (generalized) - Plan: PT plan of care cert/re-cert     Problem List Patient Active Problem List   Diagnosis Date Noted  . Routine general medical examination at a health care facility 03/26/2012  . Metabolic syndrome 59/74/1638  . CORONARY ARTERY DISEASE 08/04/2008  . COLONIC POLYPS, ADENOMATOUS 07/21/2006  . ERECTILE DYSFUNCTION 07/07/2006  . HYPERCHOLESTEROLEMIA 06/05/2006  . DEPRESSIVE DISORDER, NOS 06/05/2006  . MIGRAINE, UNSPEC., W/O INTRACTABLE MIGRAINE 06/05/2006  . HYPERTENSION, BENIGN SYSTEMIC 06/05/2006    Sumner Boast., PT 04/14/2018, 3:17 PM  [place] [address] [place], [place], [address] Phone: [phone]   Fax:  [phone]  Name: KARSEN NAKANISHI MRN: [id number] Date of Birth: [birth year]

## 2018-04-20 ENCOUNTER — Ambulatory Visit: Payer: No Typology Code available for payment source | Admitting: Physical Therapy

## 2018-04-23 ENCOUNTER — Encounter: Payer: Self-pay | Admitting: Physical Therapy

## 2018-04-23 ENCOUNTER — Ambulatory Visit: Payer: No Typology Code available for payment source | Admitting: Physical Therapy

## 2018-04-23 DIAGNOSIS — R262 Difficulty in walking, not elsewhere classified: Secondary | ICD-10-CM

## 2018-04-23 DIAGNOSIS — M6281 Muscle weakness (generalized): Secondary | ICD-10-CM

## 2018-04-23 DIAGNOSIS — M25561 Pain in right knee: Secondary | ICD-10-CM | POA: Diagnosis not present

## 2018-04-23 NOTE — Therapy (Signed)
[address] [place], [place], [address] Phone: [phone]   Fax:  [phone]  Physical Therapy Treatment  Patient Details  Name: Marco Ballard MRN: [id number] Date of Birth: 10-13-1965 Referring Provider (PT): Gara Kroner [facility]   Encounter Date: 04/23/2018  PT End of Session - 04/23/18 1431    Visit Number  2    Date for PT Re-Evaluation  06/13/18    PT Start Time  6440    PT Stop Time  1446    PT Time Calculation (min)  61 min       Past Medical History:  Diagnosis Date  . ECD (Erdheim-Chester disease) [facility])     Past Surgical History:  Procedure Laterality Date  . distal femur replacement Right 01/08/2018  . IMN left femur Left 01/01/2018  . TONSILLECTOMY      There were no vitals filed for this visit.  Subjective Assessment - 04/23/18 1350    Subjective  "Good and bad days" Problems with my balance    Currently in Pain?  Yes    Pain Score  8     Pain Location  Knee    Pain Orientation  Right                       [facility] Adult PT Treatment/Exercise - 04/23/18 0001      Ambulation/Gait   Ambulation/Gait  Yes    Ambulation/Gait Assistance  4: Min guard;5: Supervision    Ambulation Distance (Feet)  25 Feet    Assistive device  Straight cane    Gait Pattern  Step-through pattern;Decreased hip/knee flexion - left    Ambulation Surface  Level;Indoor    Gait Comments  enters clinic with [place] in [place], reports the most difficulty with RLE, instructed appropriate gait pattern with SPN in LUE       Knee/Hip Exercises: Aerobic   Nustep  level 4 x 6 minutes      Knee/Hip Exercises: Machines for Strengthening   Cybex Leg Press  30lb 2x10       Knee/Hip Exercises: Seated   Long Arc Quad  Both;2 sets;10 reps;Weights    Long Arc Quad Weight  2 lbs.    Hamstring Curl  Strengthening;Both;2 sets;10 reps    Hamstring Limitations  green Tband       Knee/Hip Exercises: Supine   Short Arc Quad Sets  Both;1 set;15 reps    Straight Leg Raises  Both;2 sets;5 sets      Modalities   Modalities  Cryotherapy;Vasopneumatic      Cryotherapy   Number Minutes Cryotherapy  15 Minutes    Cryotherapy Location  Knee    Type of Cryotherapy  Ice pack      Vasopneumatic   Number Minutes Vasopneumatic   15 minutes    Vasopnuematic Location   Knee    Vasopneumatic Pressure  Medium    Vasopneumatic Temperature   32               PT Short Term Goals - 04/14/18 1513      PT SHORT TERM GOAL #1   Title  independent with initial HEP    Time  2    Period  Weeks    Status  New        PT Long Term Goals - 04/14/18 1514      PT LONG TERM GOAL #1   Title  decrease pain 25%  Time  8    Period  Weeks    Status  New      PT LONG TERM GOAL #2   Title  walk with SPC x 300 feet    Time  8    Period  Weeks    Status  New      PT LONG TERM GOAL #3   Title  incresae right hip flexion in standing to 90 degrees    Time  8    Period  Weeks    Status  New      PT LONG TERM GOAL #4   Title  increase right hip strength to 4-/5    Time  8    Period  Weeks    Status  New      PT LONG TERM GOAL #5   Title  increase right knee strength to 4/5    Time  8    Period  Weeks    Status  New            Plan - 04/23/18 1431    Clinical Impression Statement  Mr Hoon stated that he had a R TKE in December that causes him the most issues. Ambulated in clinic with [place] in wrong UE. Did well with SPC in LUE. Weakness in both HS and quad more some in RLE. Difficulty with SLR more so with RLE. due to weakness . Tactile cues with SAQ to get a better quad contraction.     Rehab Potential  Good    PT Frequency  2x / week    PT Duration  8 weeks    PT Treatment/Interventions  ADLs/Self Care Home Management;Cryotherapy;Electrical Stimulation;Moist Heat;Therapeutic exercise;Therapeutic activities;Functional mobility training;Stair training;Gait training;Balance  training;Neuromuscular re-education;Patient/family education;Manual techniques    PT Next Visit Plan  start exercises to help with ROM and strength and help with function       Patient will benefit from skilled therapeutic intervention in order to improve the following deficits and impairments:  Pain, Abnormal gait, Decreased mobility, Postural dysfunction, Decreased strength, Decreased range of motion, Decreased activity tolerance, Decreased balance, Difficulty walking, Impaired flexibility  Visit Diagnosis: No diagnosis found.     Problem List Patient Active Problem List   Diagnosis Date Noted  . Routine general medical examination at a health care facility 03/26/2012  . Metabolic syndrome 37/85/8850  . CORONARY ARTERY DISEASE 08/04/2008  . COLONIC POLYPS, ADENOMATOUS 07/21/2006  . ERECTILE DYSFUNCTION 07/07/2006  . HYPERCHOLESTEROLEMIA 06/05/2006  . DEPRESSIVE DISORDER, NOS 06/05/2006  . MIGRAINE, UNSPEC., W/O INTRACTABLE MIGRAINE 06/05/2006  . HYPERTENSION, BENIGN SYSTEMIC 06/05/2006    Scot Jun, PTA 04/23/2018, 2:38 PM  [place] [address] [place], [place], [address] Phone: [phone]   Fax:  [phone]  Name: Marco Ballard MRN: [id number] Date of Birth: Jun 07, 1965

## 2018-04-28 ENCOUNTER — Encounter: Payer: Self-pay | Admitting: Physical Therapy

## 2018-04-28 ENCOUNTER — Ambulatory Visit: Payer: No Typology Code available for payment source | Admitting: Physical Therapy

## 2018-04-28 DIAGNOSIS — R262 Difficulty in walking, not elsewhere classified: Secondary | ICD-10-CM

## 2018-04-28 DIAGNOSIS — M25561 Pain in right knee: Secondary | ICD-10-CM | POA: Diagnosis not present

## 2018-04-28 DIAGNOSIS — M6281 Muscle weakness (generalized): Secondary | ICD-10-CM

## 2018-04-28 NOTE — Therapy (Signed)
[address] [place], [place], [address] Phone: [phone]   Fax:  [phone]  Physical Therapy Treatment  Patient Details  Name: Marco Ballard MRN: [id number] Date of Birth: July 19, 1965 Referring Provider (PT): Gara Kroner [facility]   Encounter Date: 04/28/2018  PT End of Session - 04/28/18 1230    Visit Number  3    Date for PT Re-Evaluation  06/13/18    PT Start Time  1145    PT Stop Time  1243    PT Time Calculation (min)  58 min    Activity Tolerance  Patient tolerated treatment well    Behavior During Therapy  [facility] for tasks assessed/performed       Past Medical History:  Diagnosis Date  . ECD (Erdheim-Chester disease) [facility])     Past Surgical History:  Procedure Laterality Date  . distal femur replacement Right 01/08/2018  . IMN left femur Left 01/01/2018  . TONSILLECTOMY      There were no vitals filed for this visit.  Subjective Assessment - 04/28/18 1150    Subjective  "Im all right today"    Currently in Pain?  Yes    Pain Score  7     Pain Location  Knee    Pain Orientation  Right         [facility] PT Assessment - 04/28/18 0001      AROM   Right/Left Knee  Right;Left    Right Knee Extension  2    Right Knee Flexion  112    Left Knee Extension  2    Left Knee Flexion  122                   [facility] Adult PT Treatment/Exercise - 04/28/18 0001      Knee/Hip Exercises: Aerobic   Nustep  level 4 x 6 minutes      Knee/Hip Exercises: Machines for Strengthening   Cybex Knee Extension  5lb 2x10     Cybex Knee Flexion  20lb 2x15    Cybex Leg Press  20lb 2x10       Knee/Hip Exercises: Standing   Forward Step Up  Both;1 set;5 sets;Step Height: 4";Hand Hold: 2    Forward Step Up Limitations  weakness with RLE      Knee/Hip Exercises: Seated   Long Arc Quad  Both;2 sets;10 reps;Weights    Long Arc Quad Weight  2 lbs.    Hamstring Curl  Strengthening;Both;2 sets;10 reps     Hamstring Limitations  blue tband       Knee/Hip Exercises: Supine   Short Arc Quad Sets  Both;15 reps;2 sets    Short Arc Quad Sets Limitations  2    Straight Leg Raises  Both;10 reps;1 set      Modalities   Modalities  Vasopneumatic      Vasopneumatic   Number Minutes Vasopneumatic   15 minutes    Vasopnuematic Location   Knee    Vasopneumatic Pressure  Medium    Vasopneumatic Temperature   32               PT Short Term Goals - 04/28/18 1234      PT SHORT TERM GOAL #1   Title  independent with initial HEP    Status  Achieved        PT Long Term Goals - 04/14/18 1514      PT LONG TERM GOAL #1  Title  decrease pain 25%    Time  8    Period  Weeks    Status  New      PT LONG TERM GOAL #2   Title  walk with SPC x 300 feet    Time  8    Period  Weeks    Status  New      PT LONG TERM GOAL #3   Title  incresae right hip flexion in standing to 90 degrees    Time  8    Period  Weeks    Status  New      PT LONG TERM GOAL #4   Title  increase right hip strength to 4-/5    Time  8    Period  Weeks    Status  New      PT LONG TERM GOAL #5   Title  increase right knee strength to 4/5    Time  8    Period  Weeks    Status  New            Plan - 04/28/18 1230    Clinical Impression Statement  Mr. Francisco did well with today's activities. He reports increase soreness from last session so lowered the weight on leg press. Pt required constant cures throughout with multiple interventions to squeeze quads for better contraction. RLE is weaker than Right. Some compensation when stepping up with RLE with step ups.      Rehab Potential  Good    PT Frequency  2x / week    PT Duration  8 weeks    PT Treatment/Interventions  ADLs/Self Care Home Management;Cryotherapy;Electrical Stimulation;Moist Heat;Therapeutic exercise;Therapeutic activities;Functional mobility training;Stair training;Gait training;Balance training;Neuromuscular re-education;Patient/family  education;Manual techniques    PT Next Visit Plan  exercises to help with ROM and strength and help with function    PT Home Exercise Plan  Mini squats at sink, marches at sink       Patient will benefit from skilled therapeutic intervention in order to improve the following deficits and impairments:  Pain, Abnormal gait, Decreased mobility, Postural dysfunction, Decreased strength, Decreased range of motion, Decreased activity tolerance, Decreased balance, Difficulty walking, Impaired flexibility  Visit Diagnosis: Difficulty in walking, not elsewhere classified  Acute pain of right knee  Muscle weakness (generalized)     Problem List Patient Active Problem List   Diagnosis Date Noted  . Routine general medical examination at a health care facility 03/26/2012  . Metabolic syndrome 54/12/8117  . CORONARY ARTERY DISEASE 08/04/2008  . COLONIC POLYPS, ADENOMATOUS 07/21/2006  . ERECTILE DYSFUNCTION 07/07/2006  . HYPERCHOLESTEROLEMIA 06/05/2006  . DEPRESSIVE DISORDER, NOS 06/05/2006  . MIGRAINE, UNSPEC., W/O INTRACTABLE MIGRAINE 06/05/2006  . HYPERTENSION, BENIGN SYSTEMIC 06/05/2006    Scot Jun, PTA 04/28/2018, 12:34 PM  [address] [place], [place], [address] Phone: [phone]   Fax:  [phone]  Name: ARMAND PREAST MRN: [id number] Date of Birth: Aug 13, 1965

## 2018-04-30 ENCOUNTER — Encounter: Payer: Non-veteran care | Admitting: Physical Therapy

## 2018-05-04 ENCOUNTER — Encounter: Payer: Non-veteran care | Admitting: Physical Therapy

## 2018-05-06 ENCOUNTER — Encounter: Payer: Self-pay | Admitting: Physical Therapy

## 2018-05-06 ENCOUNTER — Ambulatory Visit: Payer: No Typology Code available for payment source | Admitting: Physical Therapy

## 2018-05-06 DIAGNOSIS — R262 Difficulty in walking, not elsewhere classified: Secondary | ICD-10-CM

## 2018-05-06 DIAGNOSIS — M25561 Pain in right knee: Secondary | ICD-10-CM

## 2018-05-06 DIAGNOSIS — M6281 Muscle weakness (generalized): Secondary | ICD-10-CM

## 2018-05-06 NOTE — Therapy (Signed)
[address] [place], [place], [address] Phone: [phone]   Fax:  [phone]  Physical Therapy Treatment  Patient Details  Name: WILBERN PENNYPACKER MRN: [id number] Date of Birth: December 04, 1965 Referring Provider (PT): Gara Kroner [facility]   Encounter Date: 05/06/2018  PT End of Session - 05/06/18 1225    Visit Number  4    Date for PT Re-Evaluation  06/13/18    PT Start Time  1145    PT Stop Time  1240    PT Time Calculation (min)  55 min    Activity Tolerance  Patient tolerated treatment well    Behavior During Therapy  [facility] for tasks assessed/performed       Past Medical History:  Diagnosis Date  . ECD (Erdheim-Chester disease) [facility])     Past Surgical History:  Procedure Laterality Date  . distal femur replacement Right 01/08/2018  . IMN left femur Left 01/01/2018  . TONSILLECTOMY      There were no vitals filed for this visit.  Subjective Assessment - 05/06/18 1149    Subjective  "I have my good days and bad days" Bad days consist of waking up with pain    Currently in Pain?  Yes    Pain Score  7     Pain Location  Knee    Pain Orientation  Right         [facility] PT Assessment - 05/06/18 0001      Strength   Right Hip Flexion  3+/5    Left Knee Flexion  4-/5    Left Knee Extension  4-/5                   [facility] Adult PT Treatment/Exercise - 05/06/18 0001      Knee/Hip Exercises: Aerobic   Recumbent Bike  L0 seat 9 x 3 min     Nustep  level 4 x 6 minutes      Knee/Hip Exercises: Machines for Strengthening   Cybex Knee Extension  10lb RLE eccentrics 2x5     Cybex Knee Flexion  25lb 2x1    Cybex Leg Press  30lb 2x10, RLE TKE 20lb 2x5       Knee/Hip Exercises: Seated   Long Arc Quad  Right;2 sets;15 reps    Long Arc Quad Weight  3 lbs.      Vasopneumatic   Number Minutes Vasopneumatic   15 minutes    Vasopnuematic Location   Knee    Vasopneumatic Pressure  Medium    Vasopneumatic Temperature   32               PT Short Term Goals - 04/28/18 1234      PT SHORT TERM GOAL #1   Title  independent with initial HEP    Status  Achieved        PT Long Term Goals - 05/06/18 1149      PT LONG TERM GOAL #1   Title  decrease pain 25%    Status  Partially Met      PT LONG TERM GOAL #2   Title  walk with SPC x 300 feet    Status  Partially Met      PT LONG TERM GOAL #4   Title  increase right hip strength to 4-/5    Status  On-going      PT LONG TERM GOAL #5   Title  increase right knee  strength to 4/5    Status  On-going            Plan - 05/06/18 1228    Clinical Impression Statement  Noticeable R quad and HS weakness remains. Weakness in leg press, assist needed with SL. Visible quad shaking noted with RLE eccentric extension. Que's given to contract quad with LAQ.     Rehab Potential  Good    PT Frequency  2x / week    PT Duration  8 weeks    PT Treatment/Interventions  ADLs/Self Care Home Management;Cryotherapy;Electrical Stimulation;Moist Heat;Therapeutic exercise;Therapeutic activities;Functional mobility training;Stair training;Gait training;Balance training;Neuromuscular re-education;Patient/family education;Manual techniques    PT Next Visit Plan  exercises to help with ROM and strength and help with function       Patient will benefit from skilled therapeutic intervention in order to improve the following deficits and impairments:  Pain, Abnormal gait, Decreased mobility, Postural dysfunction, Decreased strength, Decreased range of motion, Decreased activity tolerance, Decreased balance, Difficulty walking, Impaired flexibility  Visit Diagnosis: Acute pain of right knee  Difficulty in walking, not elsewhere classified  Muscle weakness (generalized)     Problem List Patient Active Problem List   Diagnosis Date Noted  . Routine general medical examination at a health care facility 03/26/2012  . Metabolic  syndrome 83/43/7357  . CORONARY ARTERY DISEASE 08/04/2008  . COLONIC POLYPS, ADENOMATOUS 07/21/2006  . ERECTILE DYSFUNCTION 07/07/2006  . HYPERCHOLESTEROLEMIA 06/05/2006  . DEPRESSIVE DISORDER, NOS 06/05/2006  . MIGRAINE, UNSPEC., W/O INTRACTABLE MIGRAINE 06/05/2006  . HYPERTENSION, BENIGN SYSTEMIC 06/05/2006    Scot Jun, PTA 05/06/2018, 12:31 PM  [place] [address] [place] [place], [place], [address] Phone: [phone]   Fax:  [phone]  Name: LAKYN MANTIONE MRN: [id number] Date of Birth: 1965-11-02

## 2018-05-13 ENCOUNTER — Ambulatory Visit: Payer: No Typology Code available for payment source | Attending: Orthopedic Surgery | Admitting: Physical Therapy

## 2018-05-13 ENCOUNTER — Encounter: Payer: Self-pay | Admitting: Physical Therapy

## 2018-05-13 DIAGNOSIS — M6281 Muscle weakness (generalized): Secondary | ICD-10-CM | POA: Insufficient documentation

## 2018-05-13 DIAGNOSIS — M25561 Pain in right knee: Secondary | ICD-10-CM | POA: Insufficient documentation

## 2018-05-13 DIAGNOSIS — R262 Difficulty in walking, not elsewhere classified: Secondary | ICD-10-CM | POA: Diagnosis present

## 2018-05-13 NOTE — Therapy (Signed)
[address] [place], [place], [address] Phone: [phone]   Fax:  [phone]  Physical Therapy Treatment  Patient Details  Name: Marco Ballard MRN: [id number] Date of Birth: March 01, 1966 Referring Provider (PT): Gara Kroner Emory   Encounter Date: 05/13/2018  PT End of Session - 05/13/18 1228    Visit Number  5    Date for PT Re-Evaluation  06/13/18    PT Start Time  2035    PT Stop Time  1239    PT Time Calculation (min)  46 min    Activity Tolerance  Patient tolerated treatment well    Behavior During Therapy  [facility] for tasks assessed/performed       Past Medical History:  Diagnosis Date  . ECD (Erdheim-Chester disease) [facility])     Past Surgical History:  Procedure Laterality Date  . distal femur replacement Right 01/08/2018  . IMN left femur Left 01/01/2018  . TONSILLECTOMY      There were no vitals filed for this visit.  Subjective Assessment - 05/13/18 1154    Subjective  "A little better"    Currently in Pain?  Yes    Pain Score  6     Pain Location  Knee    Pain Orientation  Right                       [facility] Adult PT Treatment/Exercise - 05/13/18 0001      Knee/Hip Exercises: Aerobic   Recumbent Bike  L1 seat 8 x 6 min       Knee/Hip Exercises: Machines for Strengthening   Cybex Knee Extension  RLE 0lb 2x10     Cybex Knee Flexion  25lb 2x15, RLE 15lb 2x10     Cybex Leg Press  40lb 3x10, RLE TKE 20lb 2x5       Knee/Hip Exercises: Seated   Sit to Sand  2 sets;10 reps;without UE support   Elevated UBE seat      Modalities   Modalities  Vasopneumatic      Vasopneumatic   Number Minutes Vasopneumatic   10 minutes    Vasopnuematic Location   Knee    Vasopneumatic Pressure  Medium    Vasopneumatic Temperature   32               PT Short Term Goals - 04/28/18 1234      PT SHORT TERM GOAL #1   Title  independent with initial HEP    Status  Achieved         PT Long Term Goals - 05/13/18 1209      PT LONG TERM GOAL #2   Title  walk with SPC x 300 feet    Status  Partially Met      PT LONG TERM GOAL #3   Title  incresae right hip flexion in standing to 90 degrees    Status  On-going   R hip flex 40 deg           Plan - 05/13/18 1229    Clinical Impression Statement  Pt ~ 8 minutes late for today's treatment. He did well with sit to stands although that was taxing on pt. Increase weight tolerated on leg press. Unable to tolerate a load with RLE extension, despite being able to in the past.    Rehab Potential  Good    PT Treatment/Interventions  ADLs/Self Care Home Management;Cryotherapy;Electrical Stimulation;Moist  Heat;Therapeutic exercise;Therapeutic activities;Functional mobility training;Stair training;Gait training;Balance training;Neuromuscular re-education;Patient/family education;Manual techniques    PT Next Visit Plan  exercises to help with ROM and strength and help with function, gait trial down hall       Patient will benefit from skilled therapeutic intervention in order to improve the following deficits and impairments:  Pain, Abnormal gait, Decreased mobility, Postural dysfunction, Decreased strength, Decreased range of motion, Decreased activity tolerance, Decreased balance, Difficulty walking, Impaired flexibility  Visit Diagnosis: Muscle weakness (generalized)  Difficulty in walking, not elsewhere classified  Acute pain of right knee     Problem List Patient Active Problem List   Diagnosis Date Noted  . Routine general medical examination at a health care facility 03/26/2012  . Metabolic syndrome 46/96/2952  . CORONARY ARTERY DISEASE 08/04/2008  . COLONIC POLYPS, ADENOMATOUS 07/21/2006  . ERECTILE DYSFUNCTION 07/07/2006  . HYPERCHOLESTEROLEMIA 06/05/2006  . DEPRESSIVE DISORDER, NOS 06/05/2006  . MIGRAINE, UNSPEC., W/O INTRACTABLE MIGRAINE 06/05/2006  . HYPERTENSION, BENIGN SYSTEMIC 06/05/2006     Willey Blade 05/13/2018, 12:32 PM  [address] [place], [place], [address] Phone: [phone]   Fax:  [phone]  Name: Marco Ballard MRN: [id number] Date of Birth: 1965/07/04

## 2018-05-19 ENCOUNTER — Encounter: Payer: Non-veteran care | Admitting: Physical Therapy

## 2018-05-21 ENCOUNTER — Ambulatory Visit: Payer: No Typology Code available for payment source | Admitting: Physical Therapy

## 2018-05-21 ENCOUNTER — Encounter: Payer: Self-pay | Admitting: Physical Therapy

## 2018-05-21 DIAGNOSIS — R262 Difficulty in walking, not elsewhere classified: Secondary | ICD-10-CM

## 2018-05-21 DIAGNOSIS — M25561 Pain in right knee: Secondary | ICD-10-CM

## 2018-05-21 DIAGNOSIS — M6281 Muscle weakness (generalized): Secondary | ICD-10-CM

## 2018-05-21 NOTE — Therapy (Signed)
[address] [place], [place], [address] Phone: [phone]   Fax:  [phone]  Physical Therapy Treatment  Patient Details  Name: Marco Ballard MRN: [id number] Date of Birth: [birth year] Referring Provider (PT): Gara Kroner Emory   Encounter Date: 05/21/2018  PT End of Session - 05/21/18 1141    Visit Number  6    Date for PT Re-Evaluation  06/13/18    PT Start Time  1056    PT Stop Time  1142    PT Time Calculation (min)  46 min    Activity Tolerance  Patient tolerated treatment well    Behavior During Therapy  [facility] for tasks assessed/performed       Past Medical History:  Diagnosis Date  . ECD (Erdheim-Chester disease) [facility])     Past Surgical History:  Procedure Laterality Date  . distal femur replacement Right 01/08/2018  . IMN left femur Left 01/01/2018  . TONSILLECTOMY      There were no vitals filed for this visit.  Subjective Assessment - 05/21/18 1057    Subjective  Pt stated that the MD was surprised that he is using a cane to ambulate instead of RW. Knee looked good on x-rays    Currently in Pain?  Yes    Pain Score  6     Pain Location  Knee    Pain Orientation  Right                       [facility] Adult PT Treatment/Exercise - 05/21/18 0001      Knee/Hip Exercises: Aerobic   Recumbent Bike  L1 seat 8 x 6 min     Nustep  level 4 x 6 minutes      Knee/Hip Exercises: Machines for Strengthening   Cybex Knee Flexion  25lb x15, 35lb x15, RLE 15lb 2x10     Cybex Leg Press  50lb 3x10      Knee/Hip Exercises: Standing   Other Standing Knee Exercises  Standing march 3lb 2x10 SPC       Knee/Hip Exercises: Seated   Long Arc Quad  Right;3 sets;10 reps    Long Arc Quad Weight  3 lbs.    Sit to Sand  without UE support;3 sets;5 reps   elevated UBE, 2 sets from lowered seat              PT Short Term Goals - 04/28/18 1234      PT SHORT TERM GOAL #1   Title   independent with initial HEP    Status  Achieved        PT Long Term Goals - 05/21/18 1129      PT LONG TERM GOAL #1   Title  decrease pain 25%    Status  Partially Met      PT LONG TERM GOAL #2   Title  walk with SPC x 300 feet    Status  Partially Met      PT LONG TERM GOAL #3   Title  incresae right hip flexion in standing to 90 degrees    Status  On-going      PT LONG TERM GOAL #4   Title  increase right hip strength to 4-/5            Plan - 05/21/18 1147    Clinical Impression Statement  Pt did well progressing with LE strengthening, went back to LE  SL strengthening and he did well. Decrease RLE strength at end range of LAQ. Progressed to balance interventions with SBA. Cues to take bigger steps with RLE doing backwards walking.    Rehab Potential  Good    PT Frequency  2x / week    PT Duration  8 weeks    PT Treatment/Interventions  ADLs/Self Care Home Management;Cryotherapy;Electrical Stimulation;Moist Heat;Therapeutic exercise;Therapeutic activities;Functional mobility training;Stair training;Gait training;Balance training;Neuromuscular re-education;Patient/family education;Manual techniques    PT Next Visit Plan  exercises to help with ROM and strength and help with function, gait trial down hall       Patient will benefit from skilled therapeutic intervention in order to improve the following deficits and impairments:  Pain, Abnormal gait, Decreased mobility, Postural dysfunction, Decreased strength, Decreased range of motion, Decreased activity tolerance, Decreased balance, Difficulty walking, Impaired flexibility  Visit Diagnosis: Difficulty in walking, not elsewhere classified  Acute pain of right knee  Muscle weakness (generalized)     Problem List Patient Active Problem List   Diagnosis Date Noted  . Routine general medical examination at a health care facility 03/26/2012  . Metabolic syndrome 23/95/3202  . CORONARY ARTERY DISEASE 08/04/2008   . COLONIC POLYPS, ADENOMATOUS 07/21/2006  . ERECTILE DYSFUNCTION 07/07/2006  . HYPERCHOLESTEROLEMIA 06/05/2006  . DEPRESSIVE DISORDER, NOS 06/05/2006  . MIGRAINE, UNSPEC., W/O INTRACTABLE MIGRAINE 06/05/2006  . HYPERTENSION, BENIGN SYSTEMIC 06/05/2006    Marco Ballard, PTA 05/21/2018, 11:49 AM  [place] [address] [place], [place], [address] Phone: [phone]   Fax:  [phone]  Name: Marco Ballard MRN: [id number] Date of Birth: 1966-03-15

## 2018-05-27 ENCOUNTER — Ambulatory Visit: Payer: No Typology Code available for payment source | Admitting: Physical Therapy

## 2018-05-27 ENCOUNTER — Encounter: Payer: Self-pay | Admitting: Physical Therapy

## 2018-05-27 DIAGNOSIS — M6281 Muscle weakness (generalized): Secondary | ICD-10-CM

## 2018-05-27 DIAGNOSIS — R262 Difficulty in walking, not elsewhere classified: Secondary | ICD-10-CM

## 2018-05-27 DIAGNOSIS — M25561 Pain in right knee: Secondary | ICD-10-CM

## 2018-05-27 NOTE — Therapy (Signed)
[address] [place], [place], [address] Phone: [phone]   Fax:  [phone]  Physical Therapy Treatment  Patient Details  Name: Marco Ballard MRN: [id number] Date of Birth: 1965-10-23 Referring Provider (PT): Gara Kroner Emory   Encounter Date: 05/27/2018  PT End of Session - 05/27/18 1055    Visit Number  7    Date for PT Re-Evaluation  06/13/18    PT Start Time  9794    PT Stop Time  1057    PT Time Calculation (min)  42 min    Activity Tolerance  Patient tolerated treatment well    Behavior During Therapy  [facility] for tasks assessed/performed       Past Medical History:  Diagnosis Date  . ECD (Erdheim-Chester disease) [facility])     Past Surgical History:  Procedure Laterality Date  . distal femur replacement Right 01/08/2018  . IMN left femur Left 01/01/2018  . TONSILLECTOMY      There were no vitals filed for this visit.  Subjective Assessment - 05/27/18 1024    Subjective  "Pretty good"    Currently in Pain?  Yes    Pain Score  7     Pain Location  Knee    Pain Orientation  Right                       [facility] Adult PT Treatment/Exercise - 05/27/18 0001      Knee/Hip Exercises: Aerobic   Recumbent Bike  L1 seat 8 x 6 min     Nustep  level 4 x 4 minutes      Knee/Hip Exercises: Machines for Strengthening   Cybex Leg Press  60lb 3x10      Knee/Hip Exercises: Seated   Long Arc Quad  15 reps;Both;Strengthening;2 sets    Long Arc Quad Weight  3 lbs.    Marching  2 sets;Both;Weights;20 reps   3lb    Hamstring Curl  Strengthening;Both;2 sets;10 reps    Hamstring Limitations  blue tband     Sit to Sand  without UE support;10 reps;3 sets   UBE seat              PT Short Term Goals - 04/28/18 1234      PT SHORT TERM GOAL #1   Title  independent with initial HEP    Status  Achieved        PT Long Term Goals - 05/21/18 1129      PT LONG TERM GOAL #1   Title  decrease pain 25%    Status  Partially Met      PT LONG TERM GOAL #2   Title  walk with SPC x 300 feet    Status  Partially Met      PT LONG TERM GOAL #3   Title  incresae right hip flexion in standing to 90 degrees    Status  On-going      PT LONG TERM GOAL #4   Title  increase right hip strength to 4-/5            Plan - 05/27/18 1056    Clinical Impression Statement  pt able to tolerate a increase load on leg press. Reports some days he feels like he is getting better others he don't. Reports compliance with HEP but some of the movement throughout session does not seem to be getting better, like lifting LE to  get on leg press. Fatigues quick with sit to stands    Rehab Potential  Good    PT Frequency  2x / week    PT Duration  8 weeks    PT Treatment/Interventions  ADLs/Self Care Home Management;Cryotherapy;Electrical Stimulation;Moist Heat;Therapeutic exercise;Therapeutic activities;Functional mobility training;Stair training;Gait training;Balance training;Neuromuscular re-education;Patient/family education;Manual techniques       Patient will benefit from skilled therapeutic intervention in order to improve the following deficits and impairments:  Pain, Abnormal gait, Decreased mobility, Postural dysfunction, Decreased strength, Decreased range of motion, Decreased activity tolerance, Decreased balance, Difficulty walking, Impaired flexibility  Visit Diagnosis: Difficulty in walking, not elsewhere classified  Acute pain of right knee  Muscle weakness (generalized)     Problem List Patient Active Problem List   Diagnosis Date Noted  . Routine general medical examination at a health care facility 03/26/2012  . Metabolic syndrome 01/77/9390  . CORONARY ARTERY DISEASE 08/04/2008  . COLONIC POLYPS, ADENOMATOUS 07/21/2006  . ERECTILE DYSFUNCTION 07/07/2006  . HYPERCHOLESTEROLEMIA 06/05/2006  . DEPRESSIVE DISORDER, NOS 06/05/2006  . MIGRAINE, UNSPEC., W/O  INTRACTABLE MIGRAINE 06/05/2006  . HYPERTENSION, BENIGN SYSTEMIC 06/05/2006    Scot Jun, PTA 05/27/2018, 11:02 AM  [place] [address] [place], [place], [address] Phone: [phone]   Fax:  [phone]  Name: Marco Ballard MRN: [id number] Date of Birth: 11/29/1965

## 2018-05-28 ENCOUNTER — Ambulatory Visit: Payer: No Typology Code available for payment source | Admitting: Physical Therapy

## 2018-05-28 ENCOUNTER — Encounter: Payer: Self-pay | Admitting: Physical Therapy

## 2018-05-28 DIAGNOSIS — M6281 Muscle weakness (generalized): Secondary | ICD-10-CM | POA: Diagnosis not present

## 2018-05-28 DIAGNOSIS — R262 Difficulty in walking, not elsewhere classified: Secondary | ICD-10-CM

## 2018-05-28 DIAGNOSIS — M25561 Pain in right knee: Secondary | ICD-10-CM

## 2018-05-28 NOTE — Therapy (Signed)
[place] [address] [place], [place], [address] Phone: [phone]   Fax:  [phone]  Physical Therapy Treatment  Patient Details  Name: Marco Ballard MRN: [id number] Date of Birth: [birth year] Referring Provider (PT): Gara Kroner Emory   Encounter Date: 05/28/2018  PT End of Session - 05/28/18 1144    Visit Number  8    Date for PT Re-Evaluation  07/14/18    PT Start Time  1100    PT Stop Time  1149    PT Time Calculation (min)  49 min    Activity Tolerance  Patient tolerated treatment well    Behavior During Therapy  [facility] for tasks assessed/performed       Past Medical History:  Diagnosis Date  . ECD (Erdheim-Chester disease) [facility])     Past Surgical History:  Procedure Laterality Date  . distal femur replacement Right 01/08/2018  . IMN left femur Left 01/01/2018  . TONSILLECTOMY      There were no vitals filed for this visit.  Subjective Assessment - 05/28/18 1052    Subjective  Reports pain with weightbearing up to 8/10 at times, at rest minimal pain.   He reports that the surgeon felt like the pain was pretty normal.    Currently in Pain?  Yes    Pain Score  2     Pain Location  Knee    Pain Orientation  Right    Aggravating Factors   weightbearing    Pain Relieving Factors  rest pain can be 0/10                       [facility] Adult PT Treatment/Exercise - 05/28/18 0001      Knee/Hip Exercises: Aerobic   Tread Mill  1.0 MPH 3 minutes, working on weight shift allowing weight through the arms, cues for longer steps and heel toe.    Recumbent Bike  L1 seat 8 x 6 min     Nustep  level 4 x 6 minutes      Knee/Hip Exercises: Machines for Strengthening   Cybex Leg Press  20# with right only a lot of crepitus, then right only withiout weight 3x10    Hip Cybex  5# right only hip extension and abduction    Other Machine  standing under pullup machine working on AROM hip flexion, then  used 40# hip push down      Knee/Hip Exercises: Standing   Other Standing Knee Exercises  side stepping working on bigger steps                PT Short Term Goals - 04/28/18 1234      PT SHORT TERM GOAL #1   Title  independent with initial HEP    Status  Achieved        PT Long Term Goals - 05/28/18 1304      PT LONG TERM GOAL #1   Title  decrease pain 25%    Status  Partially Met      PT LONG TERM GOAL #2   Title  walk with SPC x 300 feet    Status  Partially Met      PT LONG TERM GOAL #3   Title  incresae right hip flexion in standing to 90 degrees    Status  Partially Met      PT LONG TERM GOAL #4   Title  increase right hip  strength to 4-/5    Status  On-going            Plan - 05/28/18 1302    Clinical Impression Statement  Patient was able to do some walking on the treadmill, did this to try to get better step length and less trunk lean, also really encouraged him to lift leg on his own without using his hands, he is very weak and his gait is still very poor    PT Next Visit Plan  continue to work on hip strength and gait    Consulted and Agree with Plan of Care  Patient       Patient will benefit from skilled therapeutic intervention in order to improve the following deficits and impairments:  Pain, Abnormal gait, Decreased mobility, Postural dysfunction, Decreased strength, Decreased range of motion, Decreased activity tolerance, Decreased balance, Difficulty walking, Impaired flexibility  Visit Diagnosis: Difficulty in walking, not elsewhere classified  Acute pain of right knee  Muscle weakness (generalized)     Problem List Patient Active Problem List   Diagnosis Date Noted  . Routine general medical examination at a health care facility 03/26/2012  . Metabolic syndrome 80/04/2391  . CORONARY ARTERY DISEASE 08/04/2008  . COLONIC POLYPS, ADENOMATOUS 07/21/2006  . ERECTILE DYSFUNCTION 07/07/2006  . HYPERCHOLESTEROLEMIA 06/05/2006  .  DEPRESSIVE DISORDER, NOS 06/05/2006  . MIGRAINE, UNSPEC., W/O INTRACTABLE MIGRAINE 06/05/2006  . HYPERTENSION, BENIGN SYSTEMIC 06/05/2006    Sumner Boast., PT 05/28/2018, 1:04 PM  [address] [place], [place], [address] Phone: [phone]   Fax:  [phone]  Name: Marco Ballard MRN: [id number] Date of Birth: 09/14/1965

## 2018-06-02 ENCOUNTER — Ambulatory Visit: Payer: No Typology Code available for payment source | Admitting: Physical Therapy

## 2018-06-04 ENCOUNTER — Ambulatory Visit: Payer: No Typology Code available for payment source | Admitting: Physical Therapy

## 2018-06-04 ENCOUNTER — Encounter: Payer: Self-pay | Admitting: Physical Therapy

## 2018-06-04 DIAGNOSIS — M25561 Pain in right knee: Secondary | ICD-10-CM

## 2018-06-04 DIAGNOSIS — M6281 Muscle weakness (generalized): Secondary | ICD-10-CM

## 2018-06-04 DIAGNOSIS — R262 Difficulty in walking, not elsewhere classified: Secondary | ICD-10-CM

## 2018-06-04 NOTE — Therapy (Signed)
[place] [address] [place], [place], [address] Phone: [phone]   Fax:  [phone]  Physical Therapy Treatment  Patient Details  Name: Marco Ballard MRN: [id number] Date of Birth: 01-Nov-1965 Referring Provider (PT): Gara Kroner Emory   Encounter Date: 06/04/2018  PT End of Session - 06/04/18 1142    Visit Number  9    Date for PT Re-Evaluation  07/14/18    PT Start Time  1100    PT Stop Time  1142    PT Time Calculation (min)  42 min    Activity Tolerance  Patient tolerated treatment well    Behavior During Therapy  [facility] for tasks assessed/performed       Past Medical History:  Diagnosis Date  . ECD (Erdheim-Chester disease) [facility])     Past Surgical History:  Procedure Laterality Date  . distal femur replacement Right 01/08/2018  . IMN left femur Left 01/01/2018  . TONSILLECTOMY      There were no vitals filed for this visit.  Subjective Assessment - 06/04/18 1108    Subjective  Pt reports that he had a recent death in family. Pt also stated that his knee still bothers him.     Currently in Pain?  Yes    Pain Score  6     Pain Location  Knee    Pain Orientation  Right                       [facility] Adult PT Treatment/Exercise - 06/04/18 0001      Knee/Hip Exercises: Aerobic   Nustep  level 3 x 6 minutes LE only      Knee/Hip Exercises: Machines for Strengthening   Cybex Leg Press  20# with right only a lot of crepitus, then right only withiout weight 3x10    Hip Cybex  5# right only hip extension and abduction    Other Machine  standing under pullup machine working on AROM hip flexion, then used 40# hip push down      Knee/Hip Exercises: Standing   Other Standing Knee Exercises  side stepping working on bigger steps       Knee/Hip Exercises: Seated   Long Arc Quad  Right;2 sets;10 reps   3 sec hold    Long Arc Quad Weight  3 lbs.               PT Short Term  Goals - 04/28/18 1234      PT SHORT TERM GOAL #1   Title  independent with initial HEP    Status  Achieved        PT Long Term Goals - 05/28/18 1304      PT LONG TERM GOAL #1   Title  decrease pain 25%    Status  Partially Met      PT LONG TERM GOAL #2   Title  walk with SPC x 300 feet    Status  Partially Met      PT LONG TERM GOAL #3   Title  incresae right hip flexion in standing to 90 degrees    Status  Partially Met      PT LONG TERM GOAL #4   Title  increase right hip strength to 4-/5    Status  On-going            Plan - 06/04/18 1143    Clinical Impression Statement  Pt  enters clinic reporting more R knee pain. RLE remains very weak, small limp when ambulating. Cues to focus on muscle contraction with LAQs. Hips fatigue quick with standing ext and abduction.    Rehab Potential  Good    PT Frequency  2x / week    PT Duration  8 weeks    PT Treatment/Interventions  ADLs/Self Care Home Management;Cryotherapy;Electrical Stimulation;Moist Heat;Therapeutic exercise;Therapeutic activities;Functional mobility training;Stair training;Gait training;Balance training;Neuromuscular re-education;Patient/family education;Manual techniques    PT Next Visit Plan  continue to work on hip strength and gait       Patient will benefit from skilled therapeutic intervention in order to improve the following deficits and impairments:  Pain, Abnormal gait, Decreased mobility, Postural dysfunction, Decreased strength, Decreased range of motion, Decreased activity tolerance, Decreased balance, Difficulty walking, Impaired flexibility  Visit Diagnosis: Acute pain of right knee  Difficulty in walking, not elsewhere classified  Muscle weakness (generalized)     Problem List Patient Active Problem List   Diagnosis Date Noted  . Routine general medical examination at a health care facility 03/26/2012  . Metabolic syndrome 88/33/7445  . CORONARY ARTERY DISEASE 08/04/2008  .  COLONIC POLYPS, ADENOMATOUS 07/21/2006  . ERECTILE DYSFUNCTION 07/07/2006  . HYPERCHOLESTEROLEMIA 06/05/2006  . DEPRESSIVE DISORDER, NOS 06/05/2006  . MIGRAINE, UNSPEC., W/O INTRACTABLE MIGRAINE 06/05/2006  . HYPERTENSION, BENIGN SYSTEMIC 06/05/2006    Scot Jun, PTA 06/04/2018, 11:45 AM  [place] [address] [place], [place], [address] Phone: [phone]   Fax:  [phone]  Name: TARUS BRISKI MRN: [id number] Date of Birth: 02-03-1966

## 2018-06-09 ENCOUNTER — Ambulatory Visit: Payer: No Typology Code available for payment source | Attending: Orthopedic Surgery | Admitting: Physical Therapy

## 2018-06-09 ENCOUNTER — Encounter: Payer: Self-pay | Admitting: Physical Therapy

## 2018-06-09 DIAGNOSIS — R262 Difficulty in walking, not elsewhere classified: Secondary | ICD-10-CM | POA: Diagnosis present

## 2018-06-09 DIAGNOSIS — M25561 Pain in right knee: Secondary | ICD-10-CM | POA: Diagnosis not present

## 2018-06-09 DIAGNOSIS — M6281 Muscle weakness (generalized): Secondary | ICD-10-CM | POA: Insufficient documentation

## 2018-06-09 NOTE — Therapy (Signed)
[address] [place], [place], [address] Phone: [phone]   Fax:  [phone]  Physical Therapy Treatment  Patient Details  Name: Marco Ballard MRN: [id number] Date of Birth: [birth year] Referring Provider (PT): Gara Kroner [place]   Encounter Date: 06/09/2018  PT End of Session - 06/09/18 1144    Visit Number  10    Date for PT Re-Evaluation  07/14/18    PT Start Time  1100    PT Stop Time  1144    PT Time Calculation (min)  44 min    Activity Tolerance  Patient tolerated treatment well    Behavior During Therapy  [facility] for tasks assessed/performed       Past Medical History:  Diagnosis Date  . ECD (Erdheim-Chester disease) [facility])     Past Surgical History:  Procedure Laterality Date  . distal femur replacement Right 01/08/2018  . IMN left femur Left 01/01/2018  . TONSILLECTOMY      There were no vitals filed for this visit.  Subjective Assessment - 06/09/18 1106    Subjective  "Today I am doing all right" Pt stated that sin the morning the knee give him a little trouble    Currently in Pain?  Yes    Pain Score  6     Pain Location  Knee    Pain Orientation  Right                       [facility] Adult PT Treatment/Exercise - 06/09/18 0001      Knee/Hip Exercises: Aerobic   Elliptical  I3 R2 x 1 min had to stop due to pain    Tread Mill  1.0 MPH Incline 2 3 minutes, working on weight shift allowing weight through the arms, cues for longer steps and heel toe.    Nustep  level 3 x 7 minutes LE only      Knee/Hip Exercises: Machines for Strengthening   Cybex Leg Press  50lb 3 x10 Heel raises 50lb 2x15    Hip Cybex  5# right only hip extension and abduction               PT Short Term Goals - 04/28/18 1234      PT SHORT TERM GOAL #1   Title  independent with initial HEP    Status  Achieved        PT Long Term Goals - 06/09/18 1144      PT LONG TERM GOAL #1   Title  decrease pain 25%    Status  Partially Met      PT LONG TERM GOAL #2   Title  walk with SPC x 300 feet    Status  Achieved      PT LONG TERM GOAL #3   Title  incresae right hip flexion in standing to 90 degrees    Status  Partially Met            Plan - 06/09/18 1145    Clinical Impression Statement  Pt LE remain functionally weak. Multiple attempts throughout session for high surfaces to stand without LE. Pt na only stand form heave use of UE. Attempted elliptical as part of aerobic warm up but unable to complete due to Pt reports of R knee pain. No issues on leg press. Postural cues to prevent leaning with standing hip exercises.      Rehab Potential  Good  PT Frequency  2x / week    PT Duration  8 weeks    PT Treatment/Interventions  ADLs/Self Care Home Management;Cryotherapy;Electrical Stimulation;Moist Heat;Therapeutic exercise;Therapeutic activities;Functional mobility training;Stair training;Gait training;Balance training;Neuromuscular re-education;Patient/family education;Manual techniques    PT Next Visit Plan  continue to work on hip strength and gait       Patient will benefit from skilled therapeutic intervention in order to improve the following deficits and impairments:  Pain, Abnormal gait, Decreased mobility, Postural dysfunction, Decreased strength, Decreased range of motion, Decreased activity tolerance, Decreased balance, Difficulty walking, Impaired flexibility  Visit Diagnosis: Acute pain of right knee  Difficulty in walking, not elsewhere classified  Muscle weakness (generalized)     Problem List Patient Active Problem List   Diagnosis Date Noted  . Routine general medical examination at a health care facility 03/26/2012  . Metabolic syndrome 65/39/9085  . CORONARY ARTERY DISEASE 08/04/2008  . COLONIC POLYPS, ADENOMATOUS 07/21/2006  . ERECTILE DYSFUNCTION 07/07/2006  . HYPERCHOLESTEROLEMIA 06/05/2006  . DEPRESSIVE DISORDER, NOS  06/05/2006  . MIGRAINE, UNSPEC., W/O INTRACTABLE MIGRAINE 06/05/2006  . HYPERTENSION, BENIGN SYSTEMIC 06/05/2006    Scot Jun, PTA 06/09/2018, 11:50 AM  [address] [place], [place], [address] Phone: [phone]   Fax:  [phone]  Name: BLAYTON HUTTNER MRN: [id number] Date of Birth: Aug 02, 1965

## 2018-06-11 ENCOUNTER — Ambulatory Visit: Payer: No Typology Code available for payment source | Admitting: Physical Therapy

## 2018-06-11 ENCOUNTER — Encounter: Payer: Self-pay | Admitting: Physical Therapy

## 2018-06-11 DIAGNOSIS — M25561 Pain in right knee: Secondary | ICD-10-CM | POA: Diagnosis not present

## 2018-06-11 DIAGNOSIS — R262 Difficulty in walking, not elsewhere classified: Secondary | ICD-10-CM

## 2018-06-11 DIAGNOSIS — M6281 Muscle weakness (generalized): Secondary | ICD-10-CM

## 2018-06-11 NOTE — Therapy (Signed)
[address] [place], [place], [address] Phone: [phone]   Fax:  [phone]  Physical Therapy Treatment  Patient Details  Name: REYDEN SMITH MRN: [id number] Date of Birth: Aug 05, 1965 Referring Provider (PT): Gara Kroner [place]   Encounter Date: 06/11/2018  PT End of Session - 06/11/18 1057    Visit Number  11    Date for PT Re-Evaluation  07/14/18    PT Start Time  5573    PT Stop Time  1058    PT Time Calculation (min)  43 min    Activity Tolerance  Patient tolerated treatment well    Behavior During Therapy  [facility] for tasks assessed/performed       Past Medical History:  Diagnosis Date  . ECD (Erdheim-Chester disease) [facility])     Past Surgical History:  Procedure Laterality Date  . distal femur replacement Right 01/08/2018  . IMN left femur Left 01/01/2018  . TONSILLECTOMY      There were no vitals filed for this visit.  Subjective Assessment - 06/11/18 1013    Subjective  Achy down his R knee,L knee not bothering him much today    Currently in Pain?  Yes    Pain Score  6     Pain Location  Knee    Pain Orientation  Right    Pain Descriptors / Indicators  Aching                       [facility] Adult PT Treatment/Exercise - 06/11/18 0001      Knee/Hip Exercises: Aerobic   Recumbent Bike  L1 seat 8 x 6 min       Knee/Hip Exercises: Machines for Strengthening   Cybex Knee Flexion  35lb 2x10    Cybex Leg Press  50lb 2 x15 Heel raises 50lb 2x15    Hip Cybex  5# right only hip extension and abduction      Knee/Hip Exercises: Standing   Other Standing Knee Exercises  Slow descent to blue chair with airex working on eccentric sit to stand  2x10       Knee/Hip Exercises: Seated   Long Arc Quad  Right;2 sets;10 reps    Long Arc Quad Weight  3 lbs.               PT Short Term Goals - 04/28/18 1234      PT SHORT TERM GOAL #1   Title  independent with initial HEP    Status  Achieved        PT Long Term Goals - 06/09/18 1144      PT LONG TERM GOAL #1   Title  decrease pain 25%    Status  Partially Met      PT LONG TERM GOAL #2   Title  walk with SPC x 300 feet    Status  Achieved      PT LONG TERM GOAL #3   Title  incresae right hip flexion in standing to 90 degrees    Status  Partially Met            Plan - 06/11/18 1058    Clinical Impression Statement  weakness noted with eccentric lowering with sit to stands. Pt did report some quat shaking. Progressed to additional sets with standing hip exercises, pt reports that he could feel it in his hips. Visible quad shaking with LAQ.     PT  Frequency  2x / week    PT Duration  8 weeks    PT Treatment/Interventions  ADLs/Self Care Home Management;Cryotherapy;Electrical Stimulation;Moist Heat;Therapeutic exercise;Therapeutic activities;Functional mobility training;Stair training;Gait training;Balance training;Neuromuscular re-education;Patient/family education;Manual techniques    PT Next Visit Plan  continue to work on hip strength and gait       Patient will benefit from skilled therapeutic intervention in order to improve the following deficits and impairments:  Pain, Abnormal gait, Decreased mobility, Postural dysfunction, Decreased strength, Decreased range of motion, Decreased activity tolerance, Decreased balance, Difficulty walking, Impaired flexibility  Visit Diagnosis: Acute pain of right knee  Difficulty in walking, not elsewhere classified  Muscle weakness (generalized)     Problem List Patient Active Problem List   Diagnosis Date Noted  . Routine general medical examination at a health care facility 03/26/2012  . Metabolic syndrome 15/52/0802  . CORONARY ARTERY DISEASE 08/04/2008  . COLONIC POLYPS, ADENOMATOUS 07/21/2006  . ERECTILE DYSFUNCTION 07/07/2006  . HYPERCHOLESTEROLEMIA 06/05/2006  . DEPRESSIVE DISORDER, NOS 06/05/2006  . MIGRAINE, UNSPEC., W/O INTRACTABLE  MIGRAINE 06/05/2006  . HYPERTENSION, BENIGN SYSTEMIC 06/05/2006    Scot Jun, PTA 06/11/2018, 11:05 AM  [address] [place], [place], [address] Phone: [phone]   Fax:  [phone]  Name: KIJANA CROMIE MRN: [id number] Date of Birth: Dec 07, 1965

## 2018-06-12 ENCOUNTER — Ambulatory Visit: Payer: No Typology Code available for payment source | Admitting: Physical Therapy

## 2018-06-16 ENCOUNTER — Ambulatory Visit: Payer: No Typology Code available for payment source | Admitting: Physical Therapy

## 2018-06-18 ENCOUNTER — Ambulatory Visit: Payer: No Typology Code available for payment source | Admitting: Physical Therapy

## 2018-06-19 ENCOUNTER — Ambulatory Visit: Payer: No Typology Code available for payment source | Admitting: Physical Therapy

## 2018-06-19 ENCOUNTER — Encounter: Payer: Self-pay | Admitting: Physical Therapy

## 2018-06-19 ENCOUNTER — Other Ambulatory Visit: Payer: Self-pay

## 2018-06-19 DIAGNOSIS — R262 Difficulty in walking, not elsewhere classified: Secondary | ICD-10-CM

## 2018-06-19 DIAGNOSIS — M25561 Pain in right knee: Secondary | ICD-10-CM | POA: Diagnosis not present

## 2018-06-19 DIAGNOSIS — M6281 Muscle weakness (generalized): Secondary | ICD-10-CM

## 2018-06-19 NOTE — Therapy (Signed)
[address] [place], [place], [address] Phone: [phone]   Fax:  [phone]  Physical Therapy Treatment  Patient Details  Name: Marco Ballard MRN: [id number] Date of Birth: 07-Oct-1965 Referring Provider (PT): Gara Kroner [place]   Encounter Date: 06/19/2018  PT End of Session - 06/19/18 1058    Visit Number  12    Date for PT Re-Evaluation  07/14/18    PT Start Time  1016    PT Stop Time  1058    PT Time Calculation (min)  42 min    Activity Tolerance  Patient tolerated treatment well    Behavior During Therapy  [facility] for tasks assessed/performed       Past Medical History:  Diagnosis Date  . ECD (Erdheim-Chester disease) [facility])     Past Surgical History:  Procedure Laterality Date  . distal femur replacement Right 01/08/2018  . IMN left femur Left 01/01/2018  . TONSILLECTOMY      There were no vitals filed for this visit.  Subjective Assessment - 06/19/18 1019    Subjective  "About the same"    Currently in Pain?  Yes    Pain Score  6     Pain Location  Knee    Pain Orientation  Right         [facility] PT Assessment - 06/19/18 0001      Strength   Right Hip Flexion  4/5    Right Hip ABduction  4/5    Right Hip ADduction  4/5    Right Knee Flexion  3+/5    Right Knee Extension  3+/5                   [facility] Adult PT Treatment/Exercise - 06/19/18 0001      Knee/Hip Exercises: Aerobic   Nustep  level 3 x 6 minutes LE only      Knee/Hip Exercises: Machines for Strengthening   Cybex Leg Press  50lb 2 x15; RLE 20lb 3x5       Knee/Hip Exercises: Standing   Other Standing Knee Exercises  Hip ext, abd, and march 3lb 2x15  RLE  hip flex then ext knee 2x 5       Knee/Hip Exercises: Seated   Long Arc Quad  Right;2 sets;15 reps    Long Arc Quad Weight  3 lbs.               PT Short Term Goals - 04/28/18 1234      PT SHORT TERM GOAL #1   Title  independent with  initial HEP    Status  Achieved        PT Long Term Goals - 06/19/18 1029      PT LONG TERM GOAL #4   Title  increase right hip strength to 4-/5    Status  Partially Met      PT LONG TERM GOAL #5   Title  increase right knee strength to 4/5    Status  On-going   3+/5           Plan - 06/19/18 1059    Clinical Impression Statement  Very little progress with R quad and HS strength made. Pt has progressed with R hip strength. Pt became very fatigue with standing hip and quad exercises. He was not will ing to increase resistance on leg press despite being at 50lb the previous treatment. He did however completed some  single leg reps on leg press. Pt also unwilling to attempt outdoor ambulation, express the importance that we needled to show functional progress.     Rehab Potential  Good    PT Frequency  2x / week    PT Duration  8 weeks    PT Treatment/Interventions  ADLs/Self Care Home Management;Cryotherapy;Electrical Stimulation;Moist Heat;Therapeutic exercise;Therapeutic activities;Functional mobility training;Stair training;Gait training;Balance training;Neuromuscular re-education;Patient/family education;Manual techniques    PT Next Visit Plan  Meet outside to ambulate around front [place]       Patient will benefit from skilled therapeutic intervention in order to improve the following deficits and impairments:  Pain, Abnormal gait, Decreased mobility, Postural dysfunction, Decreased strength, Decreased range of motion, Decreased activity tolerance, Decreased balance, Difficulty walking, Impaired flexibility  Visit Diagnosis: Acute pain of right knee  Difficulty in walking, not elsewhere classified  Muscle weakness (generalized)     Problem List Patient Active Problem List   Diagnosis Date Noted  . Routine general medical examination at a health care facility 03/26/2012  . Metabolic syndrome 56/15/4884  . CORONARY ARTERY DISEASE 08/04/2008  . COLONIC POLYPS,  ADENOMATOUS 07/21/2006  . ERECTILE DYSFUNCTION 07/07/2006  . HYPERCHOLESTEROLEMIA 06/05/2006  . DEPRESSIVE DISORDER, NOS 06/05/2006  . MIGRAINE, UNSPEC., W/O INTRACTABLE MIGRAINE 06/05/2006  . HYPERTENSION, BENIGN SYSTEMIC 06/05/2006    Scot Jun, PTA 06/19/2018, 11:06 AM  [address], [place], [address] Phone: [phone]   Fax:  [phone]  Name: DOLPH TAVANO MRN: [id number] Date of Birth: 1966-03-25

## 2018-06-22 ENCOUNTER — Encounter: Payer: Non-veteran care | Admitting: Physical Therapy

## 2018-06-26 ENCOUNTER — Ambulatory Visit: Payer: No Typology Code available for payment source | Admitting: Physical Therapy

## 2018-10-31 ENCOUNTER — Other Ambulatory Visit: Payer: Self-pay

## 2018-10-31 ENCOUNTER — Emergency Department (HOSPITAL_COMMUNITY): Payer: No Typology Code available for payment source

## 2018-10-31 ENCOUNTER — Emergency Department (HOSPITAL_COMMUNITY)
Admission: EM | Admit: 2018-10-31 | Discharge: 2018-10-31 | Disposition: A | Discharge status: Home / Self Care | Payer: No Typology Code available for payment source | Attending: Emergency Medicine | Admitting: Emergency Medicine

## 2018-10-31 DIAGNOSIS — I2510 Atherosclerotic heart disease of native coronary artery without angina pectoris: Secondary | ICD-10-CM | POA: Insufficient documentation

## 2018-10-31 DIAGNOSIS — Z7982 Long term (current) use of aspirin: Secondary | ICD-10-CM | POA: Insufficient documentation

## 2018-10-31 DIAGNOSIS — I10 Essential (primary) hypertension: Secondary | ICD-10-CM | POA: Diagnosis not present

## 2018-10-31 DIAGNOSIS — Z79899 Other long term (current) drug therapy: Secondary | ICD-10-CM | POA: Diagnosis not present

## 2018-10-31 DIAGNOSIS — R0789 Other chest pain: Secondary | ICD-10-CM | POA: Diagnosis not present

## 2018-10-31 DIAGNOSIS — R079 Chest pain, unspecified: Secondary | ICD-10-CM

## 2018-10-31 DIAGNOSIS — Z880 Allergy status to penicillin: Secondary | ICD-10-CM | POA: Diagnosis not present

## 2018-10-31 LAB — CBC
HCT: 41.9 % (ref 39.0–52.0)
Hemoglobin: 13.5 g/dL (ref 13.0–17.0)
MCH: 29.5 pg (ref 26.0–34.0)
MCHC: 32.2 g/dL (ref 30.0–36.0)
MCV: 91.5 fL (ref 80.0–100.0)
Platelets: 397 10*3/uL (ref 150–400)
RBC: 4.58 MIL/uL (ref 4.22–5.81)
RDW: 15 % (ref 11.5–15.5)
WBC: 16 10*3/uL — ABNORMAL HIGH (ref 4.0–10.5)
nRBC: 0 % (ref 0.0–0.2)

## 2018-10-31 LAB — TROPONIN I (HIGH SENSITIVITY)
Troponin I (High Sensitivity): 10 ng/L (ref <18)
Troponin I (High Sensitivity): 11 ng/L (ref <18)

## 2018-10-31 LAB — LIPID PANEL
Cholesterol: 219 mg/dL — ABNORMAL HIGH (ref 0–200)
HDL: 39 mg/dL — ABNORMAL LOW (ref >40)
LDL Cholesterol: 131 mg/dL — ABNORMAL HIGH (ref 0–99)
Total CHOL/HDL Ratio: 5.6 RATIO
Triglycerides: 243 mg/dL — ABNORMAL HIGH (ref <150)
VLDL: 49 mg/dL — ABNORMAL HIGH (ref 0–40)

## 2018-10-31 LAB — BASIC METABOLIC PANEL
Anion gap: 12 (ref 5–15)
BUN: 10 mg/dL (ref 6–20)
CO2: 24 mmol/L (ref 22–32)
Calcium: 8.9 mg/dL (ref 8.9–10.3)
Chloride: 101 mmol/L (ref 98–111)
Creatinine, Ser: 1.05 mg/dL (ref 0.61–1.24)
GFR calc Af Amer: >60 mL/min (ref >60)
GFR calc non Af Amer: >60 mL/min (ref >60)
Glucose, Bld: 101 mg/dL — ABNORMAL HIGH (ref 70–99)
Potassium: 3.6 mmol/L (ref 3.5–5.1)
Sodium: 137 mmol/L (ref 135–145)

## 2018-10-31 MED ORDER — SODIUM CHLORIDE 0.9% FLUSH: 3.0000 mL | Freq: Once | INTRAVENOUS | Status: DC

## 2018-10-31 NOTE — Discharge Instructions (Addendum)
Take one baby aspirin daily You should be contacted by cardiology for follow-up, if they have not contacted you by noon on Monday, please call Dr. Naida Sleight office. Return if you have worsening symptoms especially chest pain, dyspnea, fever or any new symptoms.

## 2018-10-31 NOTE — ED Notes (Signed)
Pt able to ambulate using RW. Denies any chest pain. Voice complaints of pain in both knees. Nurse notified

## 2018-10-31 NOTE — ED Provider Notes (Signed)
[facility] DE[facility] Provider Note   CSN: [id number] Arrival date & time: 10/31/18  0014     History   Chief Complaint Chief Complaint  Patient presents with  . Chest Pain    HPI Marco Ballard is a 53 y.o. male.     HPI  53 year old male known history of coronary artery disease with MI in 2007 presents today with chest pain that began last night at 10:30 PM.  He had eaten just prior.  He was resting sitting up from the television set when he began to have substernal pain that radiated through to his back rated at 10 out of 10 with associated diaphoresis.  He took an aspirin at home.  EMS transported him and gave him more aspirin.  He denies any other medications.  He states the pain went away after approximately 1 hour and is currently at 0.  He denies having any symptoms since his MI.  He was followed by Dr. Alvester Chou in the past.  He currently follows with the Dr. Warren Lacy and goes to the [facility] hospital.  He reports that he takes only his blood pressure medication at this time.  He is not a smoker and denies alcohol use. 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 ECD (Erdheim-Chester disease) [facility])     Patient Active Problem List   Diagnosis Date Noted  . Routine general medical examination at a health care facility 03/26/2012  . Metabolic syndrome 51/05/5850  . CORONARY ARTERY DISEASE 08/04/2008  . COLONIC POLYPS, ADENOMATOUS 07/21/2006  . ERECTILE DYSFUNCTION 07/07/2006  . HYPERCHOLESTEROLEMIA 06/05/2006  . DEPRESSIVE DISORDER, NOS 06/05/2006  . MIGRAINE, UNSPEC., W/O INTRACTABLE MIGRAINE 06/05/2006  . HYPERTENSION, BENIGN SYSTEMIC 06/05/2006    Past Surgical History:  Procedure Laterality Date  . distal femur replacement Right 01/08/2018  . IMN left femur Left 01/01/2018  . TONSILLECTOMY          Home Medications    Prior to Admission  medications   Medication Sig Start Date End Date Taking? Authorizing Provider  aspirin 325 MG tablet Take 325 mg by mouth daily.      [provider]  atorvastatin (LIPITOR) 40 MG tablet Take 1 tablet (40 mg total) by mouth daily. 03/26/12   Zenia Resides, MD  GABAPENTIN PO Take by mouth.    [provider]  lisinopril-hydrochlorothiazide (PRINZIDE,ZESTORETIC) 10-12.5 MG per tablet TAKE ONE TABLET BY MOUTH EVERY DAY 08/22/11   Zenia Resides, MD  MORPHINE SULFATE PO Take by mouth.    [provider]  OXYCODONE PO Take by mouth.    [provider]  polyethylene glycol-electrolytes (NULYTELY/GOLYTELY) 420 G solution Take 4,000 mLs by mouth once. 10/15/14   Billy Fischer, MD  Tadalafil (CIALIS) 2.5 MG TABS Take 1 tablet (2.5 mg total) by mouth daily. 02/17/15   Zenia Resides, MD    Family History No family history on file.  Social History Social History   Tobacco Use  . Smoking status: Never Smoker  . Smokeless tobacco: Never Used  Substance Use Topics  . Alcohol use: Yes    Alcohol/week: 2.5 standard drinks    Types: 1 Standard drinks or equivalent, 2 Cans of beer per week  . Drug use: No     Allergies   Amoxicillin   Review of Systems Review of Systems  All other systems reviewed and are  negative.    Physical Exam Updated Vital Signs BP (!) 166/102 (BP Location: Right Arm)   Pulse 85   Temp 99.2 F (37.3 C) (Oral)   Resp 16   SpO2 99%   Physical Exam Vitals signs reviewed.  Constitutional:      General: He is not in acute distress.    Appearance: He is well-developed. He is obese.  HENT:     Head: Normocephalic and atraumatic.  Eyes:     Pupils: Pupils are equal, round, and reactive to light.     Comments: Arcus senilis bilaterally  Neck:     Musculoskeletal: Normal range of motion.  Cardiovascular:     Rate and Rhythm: Normal rate and regular rhythm.     Pulses:          Radial pulses are 1+ on the right side  and 1+ on the left side.       Dorsalis pedis pulses are 1+ on the right side and 1+ on the left side.     Heart sounds: Normal heart sounds.  Pulmonary:     Effort: Pulmonary effort is normal.     Breath sounds: Normal breath sounds.  Abdominal:     General: Bowel sounds are normal.     Palpations: Abdomen is soft.  Musculoskeletal: Normal range of motion.  Skin:    General: Skin is warm and dry.     Capillary Refill: Capillary refill takes less than 2 seconds.  Neurological:     General: No focal deficit present.     Mental Status: He is alert. He is disoriented.      ED Treatments / Results  Labs (all labs ordered are listed, but only abnormal results are displayed) Labs Reviewed  BASIC METABOLIC PANEL - Abnormal; Notable for the following components:      Result Value   Glucose, Bld 101 (*)    All other components within normal limits  CBC - Abnormal; Notable for the following components:   WBC 16.0 (*)    All other components within normal limits  TROPONIN I (HIGH SENSITIVITY)  TROPONIN I (HIGH SENSITIVITY)    EKG ED ECG REPORT   Date: 10/31/2018  Rate: 88  Rhythm: normal sinus rhythm  QRS Axis: normal  Intervals: normal  ST/T Wave abnormalities: nonspecific ST changes t wave inversion lateral leads  Conduction Disutrbances:none  Narrative Interpretation:   Old EKG Reviewed: unchanged from first prior available of 2017  I have personally reviewed the EKG tracing and agree with the computerized printout as noted.  Radiology Dg Chest 2 View  Result Date: 10/31/2018 CLINICAL DATA:  53 year old male with chest pain EXAM: CHEST - 2 VIEW COMPARISON:  Chest radiograph dated 04/09/2014 FINDINGS: Shallow inspiration. No focal consolidation, pleural effusion, or pneumothorax. The cardiac silhouette is within normal limits. No acute osseous pathology. IMPRESSION: No active cardiopulmonary disease. Electronically Signed   By: Anner Crete M.D.   On: 10/31/2018 00:50     Procedures Procedures (including critical care time)  Medications Ordered in ED Medications  sodium chloride flush (NS) 0.9 % injection 3 mL (has no administration in time range)     Initial Impression / Assessment and Plan / ED Course  I have reviewed the triage vital signs and the nursing notes.  Pertinent labs & imaging results that were available during my care of the patient were reviewed by me and considered in my medical decision making (see chart for details).     Patient continues  pain free since one hour after onset of pain last night. All labs, ekg, and cxr reviewed Leukocytosis noted- similar to prior, no infectious source noted. High sensitivity troponin and repeat troponin noted normal   53 year old man known history of coronary artery disease presents last night with typical chest pain which lasted 1 hour.  Patient had troponin of 10 with repeat troponin of 11 after becoming pain-free after 1 hour.  He is currently resting comfortably.  EKG shows nonspecific ST changes T wave inversion in the lateral leads.  Unable to pull this up and use.  However after reviewing first most recent EKG of 04/09/2014 appears to have been present previously.  Heart score is 5.  Plan consult to cardiology for further evaluation and assistance with disposition home versus further inpatient evaluation Discussed with Dr. Harrington Challenger.  Patient has not had any prior recent chest pain.  Specifically he has had no exertional pain.  He continues to be pain-free in the department.  Plan ambulation in department.  If patient remains pain-free, will discharge to home and Dr. Harrington Challenger will assist with making follow-up arrangements.  I have discussed return precautions, including any return of chest pain lightheadedness, weakness, more diaphoresis, or any worsening symptoms.  Additionally he understands the need for close follow-up with cardiology.  He was instructed to take one baby aspirin daily. Patient ambulated  without pain.  We have again discussed return precautions and need for follow-up and he voices understanding  Final Clinical Impressions(s) / ED Diagnoses   Final diagnoses:  Chest pain, unspecified type    ED Discharge Orders    None       Pattricia Boss, MD 10/31/18 [phone]

## 2018-10-31 NOTE — ED Notes (Signed)
Patient verbalizes understanding of discharge instructions. Opportunity for questioning and answers were provided. Armband removed by staff, pt discharged from ED.  

## 2018-10-31 NOTE — ED Triage Notes (Signed)
Patient from home, complaining of chest pain with shortness of breath.  Patient with history, had an MI in 2007.  Pain started around 1030pm while just watching TV.  It is central and left.  Initial pain was 10/10 and took 81mg  ASA and EMS gave 243mg  en route to ED.  Pain has subsided slightly at this time.

## 2019-12-29 ENCOUNTER — Emergency Department (HOSPITAL_COMMUNITY): Payer: No Typology Code available for payment source

## 2019-12-29 ENCOUNTER — Emergency Department (HOSPITAL_COMMUNITY)
Admission: EM | Admit: 2019-12-29 | Discharge: 2019-12-31 | Disposition: A | Discharge status: Home / Self Care | Payer: No Typology Code available for payment source | Attending: Emergency Medicine | Admitting: Emergency Medicine

## 2019-12-29 DIAGNOSIS — Y9389 Activity, other specified: Secondary | ICD-10-CM | POA: Insufficient documentation

## 2019-12-29 DIAGNOSIS — M25562 Pain in left knee: Secondary | ICD-10-CM | POA: Diagnosis present

## 2019-12-29 DIAGNOSIS — M25462 Effusion, left knee: Secondary | ICD-10-CM | POA: Insufficient documentation

## 2019-12-29 DIAGNOSIS — Z96651 Presence of right artificial knee joint: Secondary | ICD-10-CM | POA: Insufficient documentation

## 2019-12-29 DIAGNOSIS — Z79899 Other long term (current) drug therapy: Secondary | ICD-10-CM | POA: Diagnosis not present

## 2019-12-29 DIAGNOSIS — W1839XA Other fall on same level, initial encounter: Secondary | ICD-10-CM | POA: Diagnosis not present

## 2019-12-29 DIAGNOSIS — Y92003 Bedroom of unspecified non-institutional (private) residence as the place of occurrence of the external cause: Secondary | ICD-10-CM | POA: Diagnosis not present

## 2019-12-29 DIAGNOSIS — Z7982 Long term (current) use of aspirin: Secondary | ICD-10-CM | POA: Insufficient documentation

## 2019-12-29 DIAGNOSIS — M25461 Effusion, right knee: Secondary | ICD-10-CM

## 2019-12-29 DIAGNOSIS — M25561 Pain in right knee: Secondary | ICD-10-CM

## 2019-12-29 MED ORDER — IBUPROFEN 400 MG PO TABS
600.0000 mg | ORAL_TABLET | Freq: Once | ORAL | Status: AC
  Administered 2019-12-29: 600 mg via ORAL
  Filled 2019-12-29: qty 1

## 2019-12-29 MED ORDER — IBUPROFEN 600 MG PO TABS: 600.0000 mg | ORAL_TABLET | Freq: Four times a day (QID) | ORAL | 0 refills | Status: AC | PRN

## 2019-12-29 MED ORDER — ACETAMINOPHEN 325 MG PO TABS: 650.0000 mg | ORAL_TABLET | Freq: Four times a day (QID) | ORAL | 0 refills | Status: AC | PRN

## 2019-12-29 MED ORDER — ACETAMINOPHEN 500 MG PO TABS
1000.0000 mg | ORAL_TABLET | Freq: Once | ORAL | Status: AC
  Administered 2019-12-29: 1000 mg via ORAL
  Filled 2019-12-29: qty 2

## 2019-12-29 NOTE — ED Notes (Signed)
Pt arrived by EMS c/o left knee pain and swelling that started Monday after pt tripped on fall on concrete. Pt states he hit the entire left side of his body, open injuries. Pt states he has been unable to bear weights on leg since the fall.  Medics gave 2 dose of 74mcg Fentanyl with some relief of pain. When pt not moving states pain is tolerable   Hx of surgery on left femur and knee.

## 2019-12-29 NOTE — Discharge Instructions (Addendum)
Make an appointment with the orthopedic office in 1 week.  You can take ibuprofen (motrin) 600 mg every 6 hours for the next 7 days for pain.  You can also take tylenol.  You can put ice on your knee for 10 minutes at a time (with a break inbetween) for the next 2 days.

## 2019-12-29 NOTE — ED Provider Notes (Signed)
[facility] EMERGENCY DEPARTMENT Provider Note   CSN: [id number] Arrival date & time: 12/29/19  1203     History Chief Complaint  Patient presents with  . Knee Pain    Marco Ballard. is a 54 y.o. male presented emergency department with left knee pain after fall.  He reports mechanical fall approximate 3 days ago, after losing his footing getting out of slippers in his bedroom.  He thinks he may have twisted his left knee.  He does not think he fell directly on it.  He has had worsening pain in his knee since then.  He is having difficulty bearing weight.  He called EMS who applied a knee brace and brought him into the ED.  Patient denies any history of knee infection, IV drug use, left knee replacement, although does report he had a rod placed in his left femur in the past.  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  He does not follow regularly with his orthopedist, who is in [place], and the patient lives in [place].  He has been taking Tylenol at home for pain but is not helping.  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 His girlfriend is present as well and helps take care of him at home.  HPI     Past Medical History:  Diagnosis Date  . 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 Routine general medical examination at a health care facility 03/26/2012  . Metabolic syndrome 42/70/6237  . CORONARY ARTERY DISEASE 08/04/2008  . COLONIC POLYPS, ADENOMATOUS 07/21/2006  . ERECTILE DYSFUNCTION 07/07/2006  . HYPERCHOLESTEROLEMIA 06/05/2006  . DEPRESSIVE DISORDER, NOS 06/05/2006  . MIGRAINE, UNSPEC., W/O INTRACTABLE MIGRAINE 06/05/2006  . HYPERTENSION, BENIGN SYSTEMIC 06/05/2006    Past Surgical History:  Procedure Laterality Date  . distal femur  replacement Right 01/08/2018  . IMN left femur Left 01/01/2018  . TONSILLECTOMY         No family history on file.  Social History   Tobacco Use  . Smoking status: Never Smoker  . Smokeless tobacco: Never Used  Substance Use Topics  . Alcohol use: Yes    Alcohol/week: 2.5 standard drinks    Types: 1 Standard drinks or equivalent, 2 Cans of beer per week  . Drug use: No    Home Medications Prior to Admission medications   Medication Sig Start Date End Date Taking? Authorizing Provider  acetaminophen (TYLENOL) 325 MG tablet Take 2 tablets (650 mg total) by mouth every 6 (six) hours as needed for up to 30 doses for mild pain or moderate pain. 12/29/19   Wyvonnia Dusky, MD  aspirin 325 MG tablet Take 325 mg by mouth daily.      [provider]  atorvastatin (LIPITOR) 40 MG tablet Take 1 tablet (40 mg total) by mouth daily. 03/26/12   Zenia Resides, MD  gabapentin (NEURONTIN) 300 MG capsule Take 900 mg by mouth 3 (three) times daily.  03/11/18   [provider]  ibuprofen (ADVIL) 600 MG tablet Take 1 tablet (600 mg total) by mouth every 6 (six) hours as needed for up to 30 doses for mild pain or moderate pain. 12/29/19   Wyvonnia Dusky, MD  lisinopril-hydrochlorothiazide (PRINZIDE,ZESTORETIC) 10-12.5 MG per tablet TAKE  ONE TABLET BY MOUTH EVERY DAY Patient not taking: No sig reported 08/22/11   Zenia Resides, MD  MORPHINE SULFATE PO Take by mouth.    [provider]  OXYCODONE PO Take by mouth.    [provider]  polyethylene glycol powder (GLYCOLAX/MIRALAX) 17 GM/SCOOP powder Take 1 Container by mouth as needed for mild constipation.    [provider]  polyethylene glycol-electrolytes (NULYTELY/GOLYTELY) 420 G solution Take 4,000 mLs by mouth once. 10/15/14   Billy Fischer, MD  Tadalafil (CIALIS) 2.5 MG TABS Take 1 tablet (2.5 mg total) by mouth daily. Patient taking differently: Take 2.5 mg by mouth daily.  02/17/15   Zenia Resides, MD    Allergies    Amoxicillin  Review of Systems   Review of Systems  Constitutional: Negative for chills and fever.  HENT: Negative for ear pain and sore throat.   Eyes: Negative for pain and visual disturbance.  Respiratory: Negative for cough and shortness of breath.   Cardiovascular: Negative for chest pain and palpitations.  Gastrointestinal: Negative for abdominal pain and vomiting.  Genitourinary: Negative for dysuria and hematuria.  Musculoskeletal: Positive for arthralgias and myalgias.  Skin: Negative for rash and wound.  Neurological: Negative for syncope, light-headedness and numbness.  Psychiatric/Behavioral: Negative for agitation and confusion.  All other systems reviewed and are negative.   Physical Exam Updated Vital Signs BP (!) 143/81 (BP Location: Right Arm)   Pulse 86   Temp 99 F (37.2 C) (Oral)   SpO2 99%   Physical Exam Vitals and nursing note reviewed.  Constitutional:      Appearance: He is well-developed.  HENT:     Head: Normocephalic and atraumatic.  Eyes:     Conjunctiva/sclera: Conjunctivae normal.  Cardiovascular:     Rate and Rhythm: Normal rate and regular rhythm.     Heart sounds: No murmur heard.   Pulmonary:     Effort: Pulmonary effort is normal. No respiratory distress.     Breath sounds: Normal breath sounds.  Abdominal:     Palpations: Abdomen is soft.     Tenderness: There is no abdominal tenderness.  Musculoskeletal:     Cervical back: Neck supple.     Comments: No focal ttp of the femur, patellar head, or fibula Moderate joint effusion with tenderness on superior lateral aspects of patella Pain with range of motion   Skin:    General: Skin is warm and dry.     Comments: Right knee replacement incision clean, healed  Neurological:     Mental Status: He is alert.     ED Results / Procedures / Treatments   Labs (all labs ordered are listed, but only abnormal results are displayed) Labs Reviewed - No  data to display  EKG None  Radiology DG Knee Complete 4 Views Left  Result Date: 12/29/2019 CLINICAL DATA:  Left knee pain after fall. EXAM: LEFT KNEE - COMPLETE 4+ VIEW COMPARISON:  None. 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 No fracture or dislocation is noted. Lobulated and slightly ill-defined lucency is seen in the proximal left tibial shaft also concerning for possible bony neoplasm. No joint effusion is noted. IMPRESSION: Postsurgical changes as described above. Lobulated and slightly ill-defined lucency seen in proximal left tibial shaft also concerning for possible bony neoplasm. No fracture or dislocation is noted. Electronically Signed   By: Bobbe Medico.D.  On: 12/29/2019 13:45    Procedures Procedures (including critical care time)  Medications Ordered in ED Medications  ibuprofen (ADVIL) tablet 600 mg (600 mg Oral Given 12/29/19 1310)  acetaminophen (TYLENOL) tablet 1,000 mg (1,000 mg Oral Given 12/29/19 1310)    ED Course  I have reviewed the triage vital signs and the nursing notes.  Pertinent labs & imaging results that were available during my care of the patient were reviewed by me and considered in my medical decision making (see chart for details).  54 yo male presenting with left knee pain and effusion after twisting or falling several days ago.  Xrays show no acute fracture.  Small lucency noted in tibia of unclear origin.  I have a low suspicion for septic arthritis given lack of infectious nidus, no fever here, and fairly good ROM without severe pain.  I suspect he may have a small hemarthrosis or injury to a tendon or ligament in his knee.  I advised RICE and f/u with orthopedics.  PO motrin and tylenol given for pain.  Patient requesting PTAR assistance to his house.  States he can "hobble a bit" at home but needs help otherwise ambulating.  I offered crutches but  he refused, stating he has them at home.  I answered all of his questions fully.  Clinical Course as of Dec 29 1727  Wed Dec 29, 2019  1348 IMPRESSION: Postsurgical changes as described above. Lobulated and slightly ill-defined lucency seen in proximal left tibial shaft also concerning for possible bony neoplasm. No fracture or dislocation is noted   [MT]  1503 Pt updated on xray films, okay for discharge with ortho f/u   [MT]    Clinical Course User Index [MT] Wyvonnia Dusky, MD    Final Clinical Impression(s) / ED Diagnoses Final diagnoses:  Acute pain of right knee  Effusion of right knee    Rx / DC Orders ED Discharge Orders         Ordered    ibuprofen (ADVIL) 600 MG tablet  Every 6 hours PRN        12/29/19 1507    acetaminophen (TYLENOL) 325 MG tablet  Every 6 hours PRN        12/29/19 1507           Wyvonnia Dusky, MD 12/29/19 1729

## 2019-12-29 NOTE — ED Notes (Signed)
PTAR called pt is 8th on the list

## 2019-12-29 NOTE — ED Notes (Signed)
PTAR called to transport pt 

## 2019-12-30 ENCOUNTER — Other Ambulatory Visit: Payer: Self-pay

## 2019-12-30 MED ORDER — IBUPROFEN 400 MG PO TABS
600.0000 mg | ORAL_TABLET | Freq: Once | ORAL | Status: AC
  Administered 2019-12-30: 600 mg via ORAL
  Filled 2019-12-30: qty 1

## 2019-12-30 NOTE — ED Notes (Signed)
Spoke with Lucia Bitter, RN regarding pt discharge home, pt will be provided cab voulcher for discharge transportation home. Pt A&Ox4, ambulatory.

## 2019-12-30 NOTE — Progress Notes (Signed)
As of this writing, no return call has been received from the [place], so CSW sent out referrals to area SNFs

## 2019-12-30 NOTE — Progress Notes (Signed)
TOC CSW is currently awaiting a call back from [place] before sending out information through hub.  CSW will continue to follow for dc needs.  Cillian Gwinner Tarpley-Carter, MSW, LCSW-A Pronouns:  She, Her, Hers                  [place] ED Transitions of CareClinical Social Worker [email or web address] [phone]

## 2019-12-30 NOTE — Evaluation (Signed)
Physical Therapy Evaluation Patient Details Name: Marco Ballard. MRN: [id number] DOB: Nov 15, 1965 Today's Date: 12/30/2019   History of Present Illness  Pt is a 54 y/o male presenting to the ED after fall. Is unable to bear weight on LLE secondary to L knee pain. No acute abnormality on imaging. PMH includes CAD.   Clinical Impression  Pt presenting secondary to problem above with deficits below. Pt with very little tolerance for L knee movement and mobility in general secondary to pain. Required min A to come to long sitting in stretcher. Was unable to sit at EOB secondary to pain in L knee. Pt currently lives alone and reports he is unable to care for himself. Recommending SNF level therapies at d/c to increase independence and safety with mobility. Will continue to follow acutely.     Follow Up Recommendations SNF    Equipment Recommendations  Wheelchair cushion (measurements PT);Wheelchair (measurements PT)    Recommendations for Other Services       Precautions / Restrictions Precautions Precautions: Fall Restrictions Weight Bearing Restrictions: No      Mobility  Bed Mobility Overal bed mobility: Needs Assistance Bed Mobility: Supine to Sit     Supine to sit: Min assist     General bed mobility comments: Pt able to come to long sitting on stretcher with min A, however, refusing to sit on the side of the stretcher saying his knee hurt too bad. Was also unable to roll secondary to pain.   Transfers                    Ambulation/Gait                Stairs            Wheelchair Mobility    Modified Rankin (Stroke Patients Only)       Balance                                             Pertinent Vitals/Pain Pain Assessment: 0-10 Pain Score: 10-Worst pain ever Pain Location: L knee  Pain Descriptors / Indicators: Grimacing;Guarding Pain Intervention(s): Limited activity within patient's tolerance;Monitored during  session;Repositioned    Home Living Family/patient expects to be discharged to:: Private residence Living Arrangements: Alone Available Help at Discharge: Other (Comment) (reports no one) Type of Home: House Home Access: Ramped entrance     Home Layout: One level Home Equipment: Grab bars - tub/shower;Walker - 2 wheels;Cane - single point      Prior Function Level of Independence: Independent               Hand Dominance        Extremity/Trunk Assessment   Upper Extremity Assessment Upper Extremity Assessment: Overall WFL for tasks assessed    Lower Extremity Assessment Lower Extremity Assessment: LLE deficits/detail LLE Deficits / Details: Pt requiring assist to fully extend knee. Was not able to flex knee past ~10 deg.     Cervical / Trunk Assessment Cervical / Trunk Assessment: Normal  Communication   Communication: No difficulties  Cognition Arousal/Alertness: Awake/alert Behavior During Therapy: WFL for tasks assessed/performed Overall Cognitive Status: No family/caregiver present to determine baseline cognitive functioning  General Comments General comments (skin integrity, edema, etc.): Educated about importance of moving L knee to help with pain management and prevent stiffness.     Exercises     Assessment/Plan    PT Assessment Patient needs continued PT services  PT Problem List Decreased strength;Decreased range of motion;Decreased balance;Decreased mobility;Decreased activity tolerance;Decreased knowledge of use of DME;Decreased knowledge of precautions;Pain       PT Treatment Interventions DME instruction;Gait training;Functional mobility training;Therapeutic activities;Therapeutic exercise;Balance training;Patient/family education    PT Goals (Current goals can be found in the Care Plan section)  Acute Rehab PT Goals Patient Stated Goal: to go to rehab PT Goal Formulation: With  patient Time For Goal Achievement: 01/13/20 Potential to Achieve Goals: Good    Frequency Min 2X/week   Barriers to discharge Decreased caregiver support      Co-evaluation               AM-PAC PT "6 Clicks" Mobility  Outcome Measure Help needed turning from your back to your side while in a flat bed without using bedrails?: A Little Help needed moving from lying on your back to sitting on the side of a flat bed without using bedrails?: A Lot Help needed moving to and from a bed to a chair (including a wheelchair)?: Total Help needed standing up from a chair using your arms (e.g., wheelchair or bedside chair)?: Total Help needed to walk in hospital room?: Total Help needed climbing 3-5 steps with a railing? : Total 6 Click Score: 9    End of Session   Activity Tolerance: Patient limited by pain Patient left: in bed;with call bell/phone within reach (on stretcher in ED ) Nurse Communication: Mobility status PT Visit Diagnosis: Difficulty in walking, not elsewhere classified (R26.2);Pain Pain - Right/Left: Left Pain - part of body: Knee    Time: 1212-1222 PT Time Calculation (min) (ACUTE ONLY): 10 min   Charges:   PT Evaluation $PT Eval Moderate Complexity: 1 Mod          Reuel Derby, PT, DPT  Acute Rehabilitation Services  Pager: [phone] Office: [phone]   Rudean Hitt 12/30/2019, 2:07 PM

## 2019-12-30 NOTE — ED Provider Notes (Signed)
PTAR unable to transport the patient.  He is immobile at baseline and no one at home to assist.  Will need transitions of care consult.   Delora Fuel, MD 35/45/62 [phone]

## 2019-12-30 NOTE — ED Provider Notes (Signed)
  Physical Exam  BP 136/82 (BP Location: Left Arm)   Pulse 85   Temp 99 F (37.2 C) (Oral)   Resp 18   Ht 5\' 10"  (1.778 m)   Wt 81.6 kg   SpO2 97%   BMI 25.83 kg/m   Physical Exam  ED Course/Procedures   Clinical Course as of Dec 29 744  Wed Dec 29, 2019  1348 IMPRESSION: Postsurgical changes as described above. Lobulated and slightly ill-defined lucency seen in proximal left tibial shaft also concerning for possible bony neoplasm. No fracture or dislocation is noted   [MT]  1503 Pt updated on xray films, okay for discharge with ortho f/u   [MT]    Clinical Course User Index [MT] Trifan, Carola Rhine, MD    Procedures  MDM  Called by social work.  They are going to attempt to get nursing home placement.  No one at home to help with him.  Will get PT evaluation also.      Davonna Belling, MD 12/30/19 [phone]

## 2019-12-30 NOTE — Progress Notes (Signed)
TOC CSW attempted to contact Mar[place]/SW at [facility] in reference to SNF.  CSW left HIPPA compliant message with my contact information.  CSW will continue to follow for dc needs.  Danial Sisley Tarpley-Carter, MSW, LCSW-A Pronouns:  She, Her, [place] ED Transitions of CareClinical Social Worker [email or web address] [phone]

## 2019-12-30 NOTE — NC FL2 (Signed)
[place] LEVEL OF CARE SCREENING TOOL     IDENTIFICATION  Patient Name: Marco Ballard. Birthdate: May 16, 1965 Sex: male Admission Date (Current Location): 12/29/2019  [facility] and [place] Number:  Herbalist and Address:  [facility]. [facility], [place] [address], [place], [place] [address]      Provider Number: [id number]  Attending Physician Name and Address:  No att. providers found  Relative Name and Phone Number:  Mathis Dad Significant other [phone]    Current Level of Care: Hospital Recommended Level of Care: [place] Prior Approval Number:    Date Approved/Denied:   PASRR Number: [id number]  Discharge Plan: SNF    Current Diagnoses: Patient Active Problem List   Diagnosis Date Noted  . Routine general medical examination at a health care facility 03/26/2012  . Metabolic syndrome 33/29/5188  . CORONARY ARTERY DISEASE 08/04/2008  . COLONIC POLYPS, ADENOMATOUS 07/21/2006  . ERECTILE DYSFUNCTION 07/07/2006  . HYPERCHOLESTEROLEMIA 06/05/2006  . DEPRESSIVE DISORDER, NOS 06/05/2006  . MIGRAINE, UNSPEC., W/O INTRACTABLE MIGRAINE 06/05/2006  . HYPERTENSION, BENIGN SYSTEMIC 06/05/2006    Orientation RESPIRATION BLADDER Height & Weight     Self, Time, Situation, Place  Normal Incontinent Weight: 180 lb (81.6 kg) Height:  5\' 10"  (177.8 cm)  BEHAVIORAL SYMPTOMS/MOOD NEUROLOGICAL BOWEL NUTRITION STATUS      Incontinent Diet (Regular)  AMBULATORY STATUS COMMUNICATION OF NEEDS Skin   Extensive Assist Verbally Normal                       Personal Care Assistance Level of Assistance  Bathing, Dressing, Feeding Bathing Assistance: Maximum assistance Feeding assistance: Limited assistance Dressing Assistance: Maximum assistance     Functional Limitations Info  Sight, Hearing, Speech Sight Info: Adequate Hearing Info: Adequate Speech Info: Adequate    SPECIAL CARE FACTORS FREQUENCY  PT (By  licensed PT), OT (By licensed OT)     PT Frequency: 5x a week OT Frequency: 5x a week            Contractures Contractures Info: Not present    Additional Factors Info  Code Status Code Status Info: Full Code             Current Medications (12/30/2019):  This is the current hospital active medication list No current facility-administered medications for this encounter.   Current Outpatient Medications  Medication Sig Dispense Refill  . acetaminophen (TYLENOL) 325 MG tablet Take 2 tablets (650 mg total) by mouth every 6 (six) hours as needed for up to 30 doses for mild pain or moderate pain. 30 tablet 0  . aspirin 325 MG tablet Take 325 mg by mouth daily.      Marland Kitchen atorvastatin (LIPITOR) 40 MG tablet Take 1 tablet (40 mg total) by mouth daily. 90 tablet 3  . gabapentin (NEURONTIN) 300 MG capsule Take 900 mg by mouth 3 (three) times daily.     Marland Kitchen ibuprofen (ADVIL) 600 MG tablet Take 1 tablet (600 mg total) by mouth every 6 (six) hours as needed for up to 30 doses for mild pain or moderate pain. 30 tablet 0  . lisinopril-hydrochlorothiazide (PRINZIDE,ZESTORETIC) 10-12.5 MG per tablet TAKE ONE TABLET BY MOUTH EVERY DAY (Patient not taking: No sig reported) 90 tablet 3  . MORPHINE SULFATE PO Take by mouth.    . OXYCODONE PO Take by mouth.    . polyethylene glycol powder (GLYCOLAX/MIRALAX) 17 GM/SCOOP powder Take 1 Container by mouth as needed for  mild constipation.    . polyethylene glycol-electrolytes (NULYTELY/GOLYTELY) 420 G solution Take 4,000 mLs by mouth once. 4000 mL 0  . Tadalafil (CIALIS) 2.5 MG TABS Take 1 tablet (2.5 mg total) by mouth daily. (Patient taking differently: Take 2.5 mg by mouth daily. ) 30 each 5     Discharge Medications: Please see discharge summary for a list of discharge medications.  Relevant Imaging Results:  Relevant Lab Results:   Additional Information SSN# [id number]  Drucilla Cumber C Tarpley-Carter, LCSWA

## 2019-12-30 NOTE — Progress Notes (Signed)
.   Transition of Care [facility]) - Emergency Department Mini Assessment   Patient Details  Name: Marco Ballard. MRN: [id number] Date of Birth: [birth year]  Transition of [facility] CM/SW Contact:    Axil Copeman C Tarpley-Carter, LCSWA Phone Number: 12/30/2019, 11:26 AM   Clinical Narrative: [facility] CSW consult request was received. CSW spoke with pt about SNF placement.  Pt was interested because he does not have anyone at home to care for him.    Currently, doctor has an order in for PT to come evaluate pt.  Pt has given CSW permission to fax out information via hub for bed offers and to [facility] with [facility] to [facility].   CSW will continue to follow for dc needs.  Jesyca Weisenburger Tarpley-Carter, MSW, LCSW-A Pronouns:  She, Her, [place] ED Transitions of CareClinical Social Worker [email or web address] [phone]   ED Mini Assessment: What brought you to the Emergency Department? : EMS  Barriers to Discharge: No Barriers Identified  Barrier interventions: To find SNF placement.  Means of departure: Ambulance  Interventions which prevented an admission or readmission: SNF Placement    Patient Contact and Communications       Contact Date: 12/30/19,   Contact time: [place] Phone Number: [phone] Call outcome: Spoke with pt remotely about SNF placement.  Patient states their goals for this hospitalization and ongoing recovery are:: To go SNF for treatment and care. CMS Medicare.gov Compare Post Acute Care list provided to:: Patient Choice offered to / list presented to : Patient  Admission diagnosis:  Fall Patient Active Problem List   Diagnosis Date Noted  . Routine general medical examination at a health care facility 03/26/2012  . Metabolic syndrome 55/73/2202  . CORONARY ARTERY DISEASE 08/04/2008  . COLONIC POLYPS, ADENOMATOUS 07/21/2006  . ERECTILE DYSFUNCTION 07/07/2006  . HYPERCHOLESTEROLEMIA 06/05/2006  .  DEPRESSIVE DISORDER, NOS 06/05/2006  . MIGRAINE, UNSPEC., W/O INTRACTABLE MIGRAINE 06/05/2006  . HYPERTENSION, BENIGN SYSTEMIC 06/05/2006   PCP:  Clinic, [place]:   [place] ([address] [place] - [address] [place] [address] Phone: [phone] Fax: [phone]

## 2019-12-31 NOTE — ED Notes (Signed)
[facility] called for pt for discharge, per pt when this RN asked for means for transferring to home, pt stated " I will get in the car and go home." Pt unable to stand to get self into [employer] in ED lobby to go home, pt able with x1 assist to stand and pivot bearing small amount of left to affected extremity. Per staff in lobby pt appeared unsteady. At this time PTAR consult placed for pt transport home.

## 2019-12-31 NOTE — ED Notes (Signed)
PTAR called to transport pt 

## 2020-11-06 DEATH — deceased

## 2021-11-22 IMAGING — DX DG KNEE COMPLETE 4+V*L*
4 series · 4 of 4 positions shown · non-contrast
Comparison: None.

CLINICAL DATA: Left knee pain after fall.

EXAM:
LEFT KNEE - COMPLETE 4+ VIEW

[knee ap]
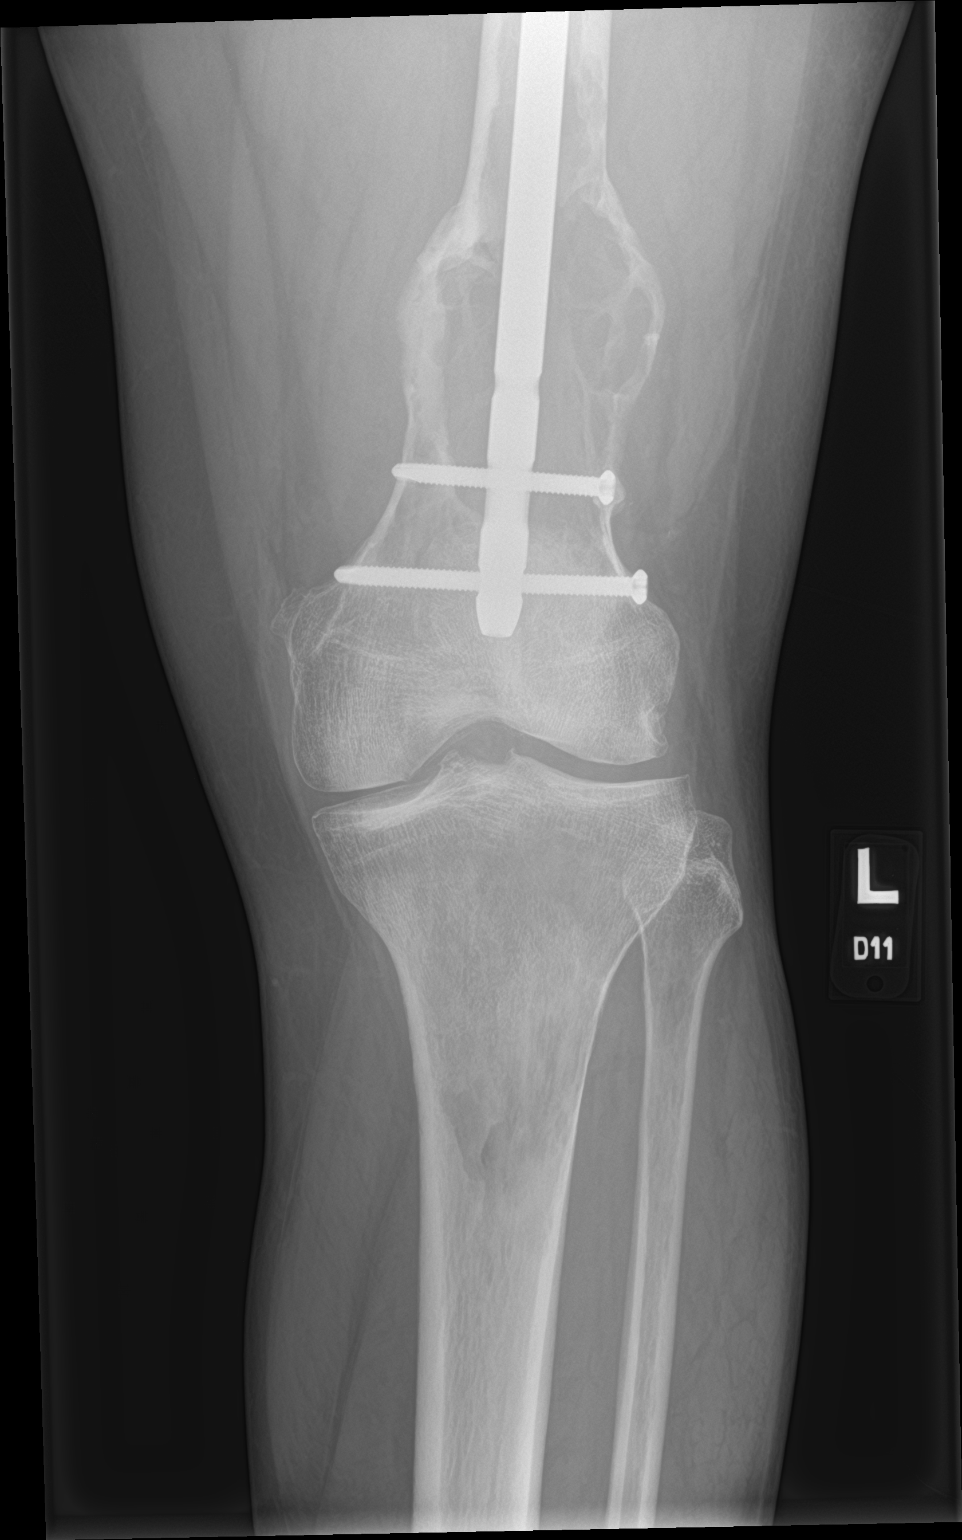

[knee lat]
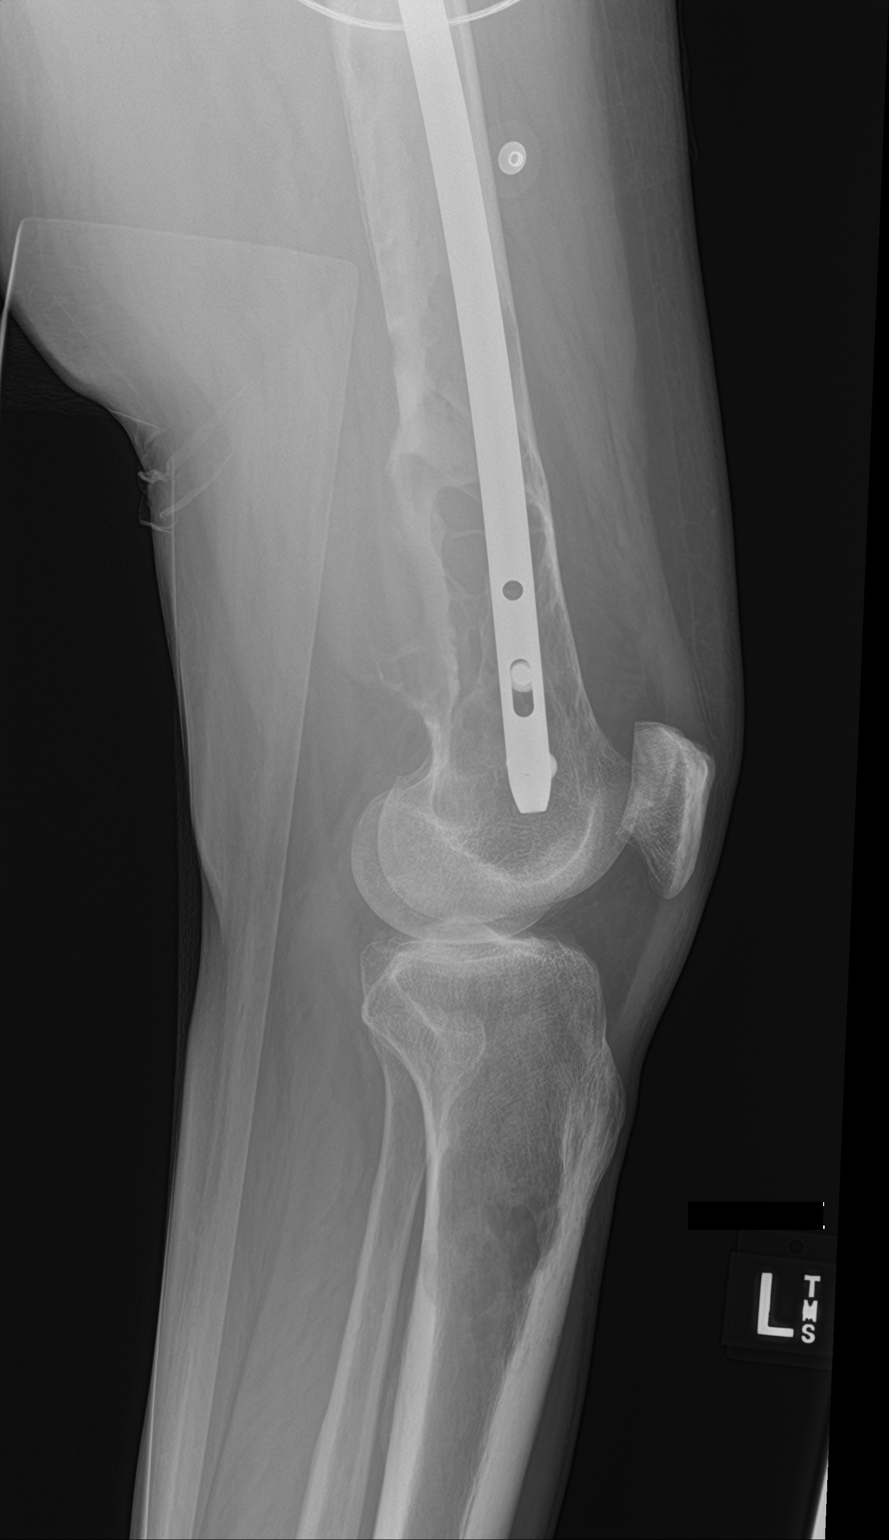

[knee obl (1 of 2)]
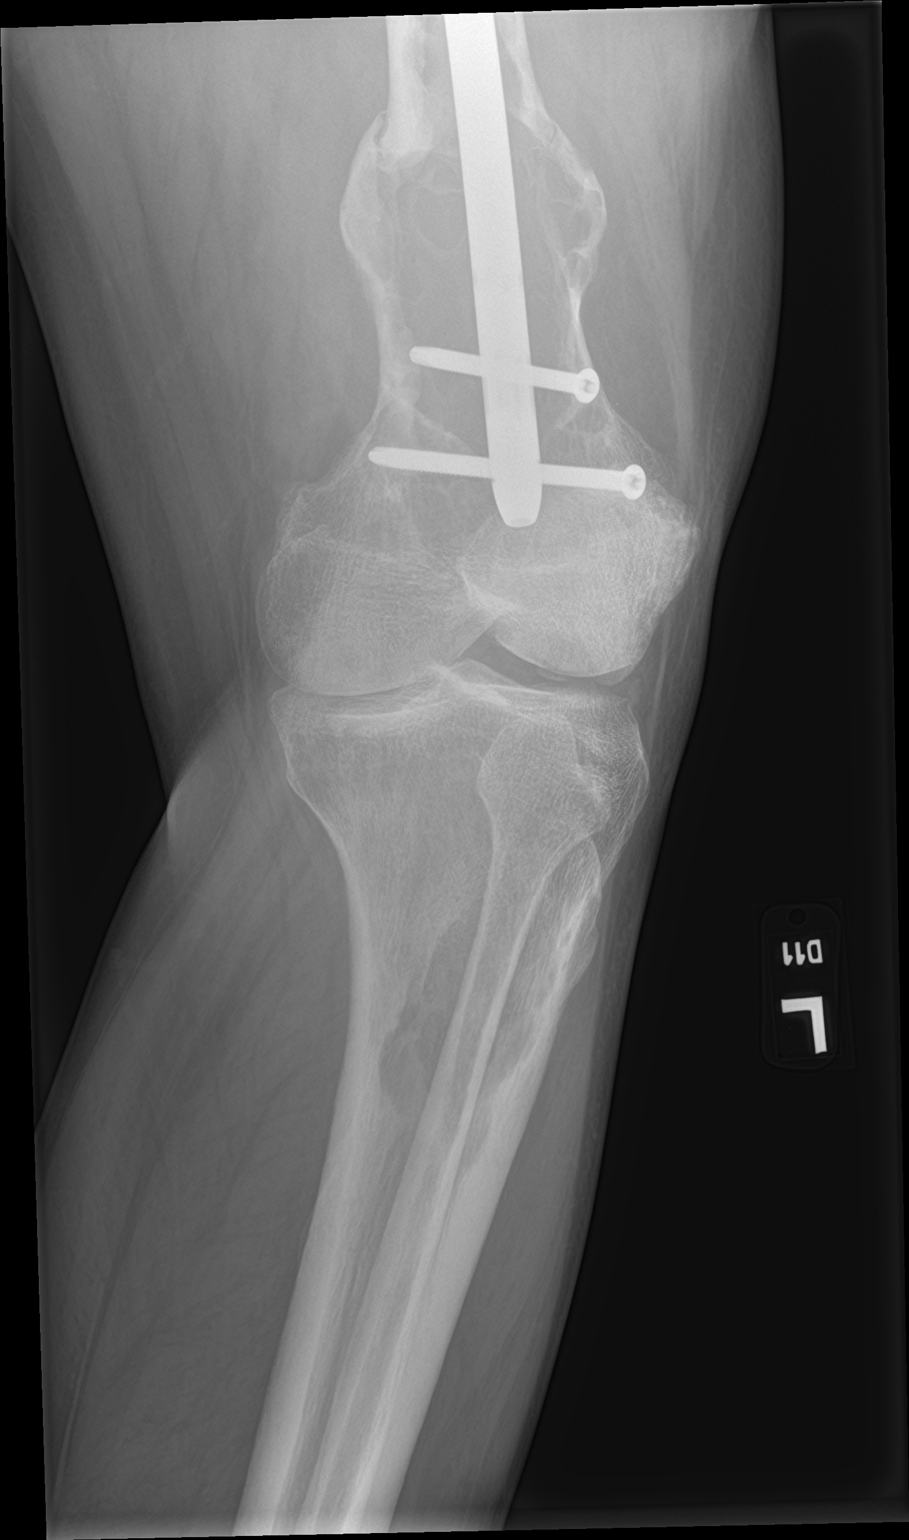

[knee obl (2 of 2)]
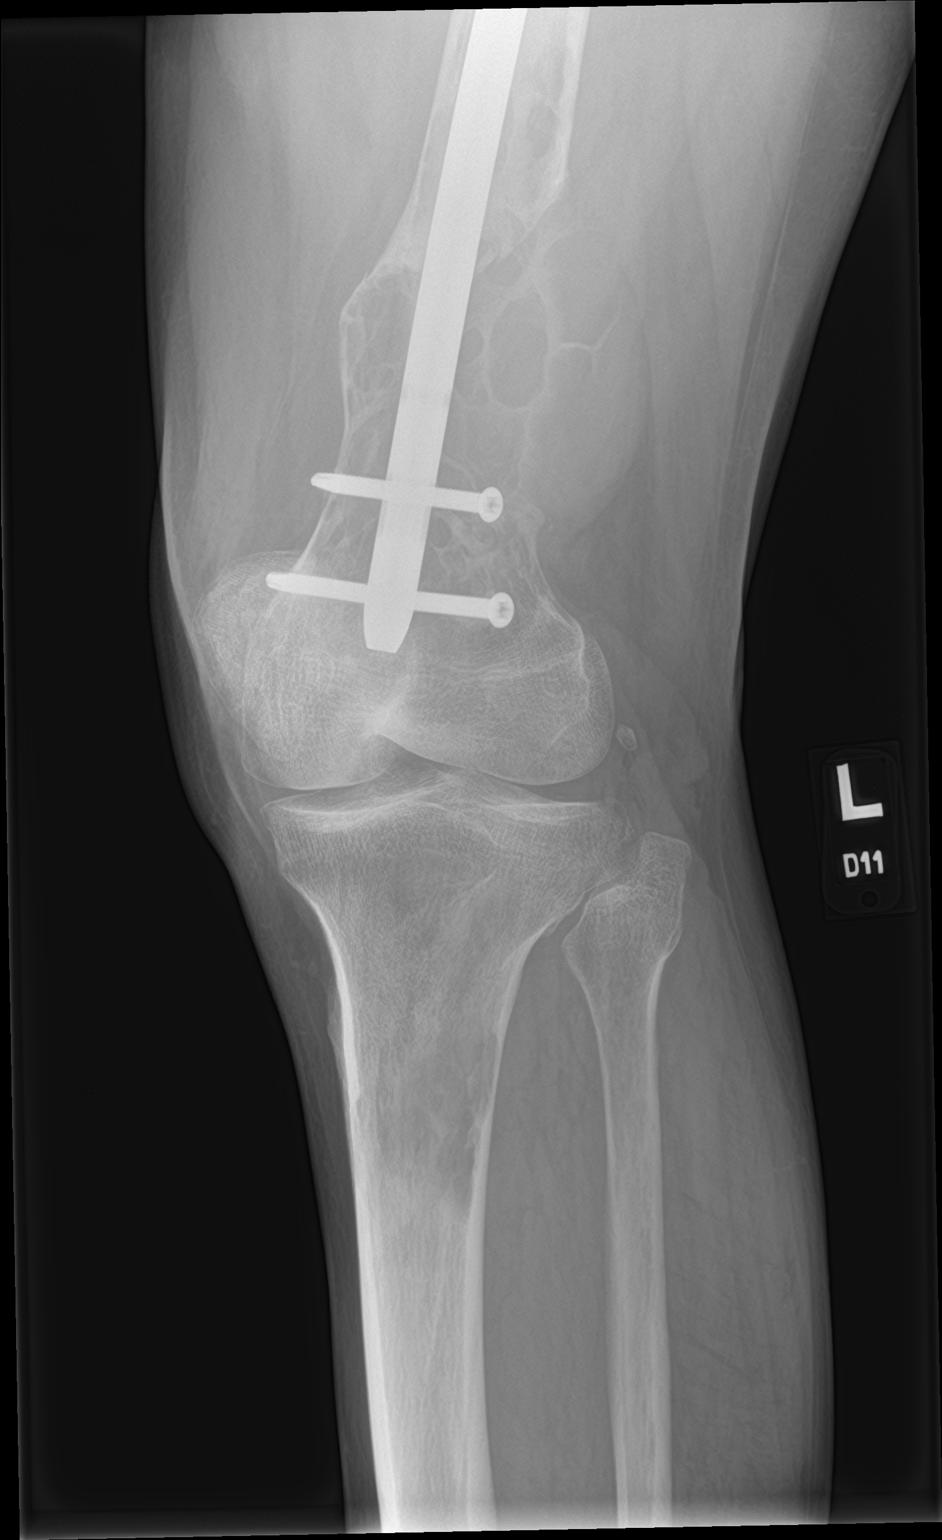

[4 of 4 positions shown; findings below may reference images not displayed]

FINDINGS: Status post intramedullary rod fixation of the left femur for
treatment of bony lytic neoplasm in distal left femoral shaft. No
fracture or dislocation is noted. Lobulated and slightly ill-defined
lucency is seen in the proximal left tibial shaft also concerning
for possible bony neoplasm. No joint effusion is noted.
IMPRESSION: Postsurgical changes as described above. Lobulated and slightly
ill-defined lucency seen in proximal left tibial shaft also
concerning for possible bony neoplasm. No fracture or dislocation is
noted.
